# Patient Record
Sex: Female | Born: 1980
Health system: Southern US, Community
[De-identification: ages and names within clinical notes are randomized; demographics above are authoritative.]

## PROBLEM LIST (undated history)

## (undated) DIAGNOSIS — O1490 Unspecified pre-eclampsia, unspecified trimester: Secondary | ICD-10-CM

## (undated) DIAGNOSIS — O139 Gestational [pregnancy-induced] hypertension without significant proteinuria, unspecified trimester: Secondary | ICD-10-CM

## (undated) DIAGNOSIS — D649 Anemia, unspecified: Secondary | ICD-10-CM

## (undated) HISTORY — DX: Unspecified pre-eclampsia, unspecified trimester: O14.90

## (undated) HISTORY — DX: Anemia, unspecified: D64.9

## (undated) HISTORY — DX: Gestational (pregnancy-induced) hypertension without significant proteinuria, unspecified trimester: O13.9

---

## 2006-11-27 DIAGNOSIS — O1490 Unspecified pre-eclampsia, unspecified trimester: Secondary | ICD-10-CM

## 2012-04-07 HISTORY — PX: UMBILICAL HERNIA REPAIR: SHX196

## 2017-04-07 HISTORY — PX: KNEE SURGERY: SHX244

## 2019-12-20 ENCOUNTER — Encounter: Payer: Self-pay | Admitting: *Deleted

## 2019-12-21 ENCOUNTER — Encounter: Payer: Self-pay | Admitting: General Practice

## 2019-12-28 ENCOUNTER — Telehealth (INDEPENDENT_AMBULATORY_CARE_PROVIDER_SITE_OTHER): Payer: Self-pay | Admitting: *Deleted

## 2019-12-28 ENCOUNTER — Other Ambulatory Visit: Payer: Self-pay

## 2019-12-28 DIAGNOSIS — O0990 Supervision of high risk pregnancy, unspecified, unspecified trimester: Secondary | ICD-10-CM | POA: Insufficient documentation

## 2019-12-28 DIAGNOSIS — O99210 Obesity complicating pregnancy, unspecified trimester: Secondary | ICD-10-CM | POA: Insufficient documentation

## 2019-12-28 DIAGNOSIS — O09299 Supervision of pregnancy with other poor reproductive or obstetric history, unspecified trimester: Secondary | ICD-10-CM | POA: Insufficient documentation

## 2019-12-28 DIAGNOSIS — Z87798 Personal history of other (corrected) congenital malformations: Secondary | ICD-10-CM | POA: Insufficient documentation

## 2019-12-28 NOTE — Patient Instructions (Addendum)
If you are in need of transportation to get to and from your appointments in our office.  You can reach Transportation Services by calling [phone] Monday - Friday  7am-6pm.    At [facility], we work as an integrated team, providing care to address both physical and emotional health. Your medical provider may refer you to see our Behavioral Health Clinician [facility]) on the same day you see your medical provider, as availability permits.  Our [facility] is available to all patients, visits generally last between 20-30 minutes, but can be longer or shorter, depending on patient need. The [facility] offers help with stress management, coping with symptoms of depression and anxiety, major life changes , sleep issues, changing risky behavior, grief and loss, life stress, working on personal life goals, and  behavioral health issues, as these all affect your overall health and wellness.  The [facility] is NOT available for the following: FMLA paperwork, court-ordered evaluations, specialty assessments (custody or disability), letters to employers, or obtaining certification for an emotional support animal. The [facility] does not provide long-term therapy. You have the right to refuse integrated behavioral health services, or to reschedule to see the [facility] at a later date.  Exception: If you are having thoughts of suicide, we require that you either see the [facility] for further assessment, or contract for safety with your medical provider. Confidentiality exception: If it is suspected that a child or disabled adult is being abused or neglected, we are required by law to report that to either Child Protective Services or Adult Management consultant.  If you have a diagnosis of Bipolar affective disorder, Schizophrenia, or recurrent Major depressive disorder, we will recommend that you establish care with a psychiatrist, as these are lifelong, chronic conditions, and we want your overall emotional  health and medications to be more closely monitored. If you anticipate needing extended maternity leave due to mental illness, it it recommended you inform your medical provider, so we can put in a referral to a  psychiatrist as soon as possible. The [facility] is unable to recommend an extended maternity leave for mental health issues. Your medical provider or [facility] may refer you to a therapist for ongoing, traditional therapy, or to a psychiatrist, for medication management, if it would benefit your overall health. Depending on your insurance, you may have a copay to see the [facility]. If you are uninsured, it is recommended that you apply for financial assistance. (Forms may be requested at the front desk for in-person visits, via MyChart, or request a form during a virtual visit).  If you see the [facility] more than 6 times, you will have to complete a comprehensive clinical assessment interview with the [facility] to resume integrated services.  For virtual visits with the [facility], you must be physically in the state of [place] at the time of the visit. For example, if you live in [place], you will have to do an in-person visit with the [facility]. If you are going out of the state or country for any reason, the [facility] may see you virtually when you return to [place], but not while you are physically outside of [place].

## 2019-12-28 NOTE — Progress Notes (Addendum)
3:24 Chancey not connected virtually. I called her and explained her visit is virtual. We discussed she had not yet signed up for [facility]  But that I can send a text and we will do visit. She agreed with plan.  New OB Intake  I connected with  Durward Mallard on 12/28/19 at  3:15 PM EDT by MyChart and verified that I am speaking with the correct person using two identifiers. Nurse is located at [facility] and pt is located at home.  I discussed the limitations, risks, security and privacy concerns of performing an evaluation and management service by telephone and the availability of in person appointments. I also discussed with the patient that there may be a patient responsible charge related to this service. The patient expressed understanding and agreed to proceed.  I explained I am completing New OB Intake today. We discussed her EDD of 05/26/20 that is based on LMP of 08/20/19. Pt is G5/P3012. I reviewed her allergies, medications, Medical/Surgical/OB history, and appropriate screenings. I informed her of [facility] services. 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 I informed her we would need to move her new ob to an MD instead of APP as scheduled.  She does have transportation issues. I explained we can send a referral to  [facility] Transportation to use for prenatal appointments. She will sign a release at new ob appointment or sooner. Number to call given to her today. Transporation referral sent. We discussed WIC and she states she already has WIC.   Concerns addressed today  MyChart/Babyscripts Verified patient has received MyChart text and explained she needs to complete registration today after our visit and let us know if she has any issues.   Blood Pressure Cuff Patient does not have a blood pressure cuff . Per chart review does not have insurance. She states she has insurance but not sure of name. I explained when  she is in the office we will follow up and order bp cuff rx if covered by her insurance.  Explained after first prenatal appt we ask all OB patients  To  check  BP weekly and document in Babyscripts.  Anatomy US Explained first scheduled [place] will be around 19 weeks. Anatomy US scheduled for  First available  at 01/11/20. Pt notified to arrive at 0800.   Labs Discussed Avelina Laine genetic screening with patient. Would like both Panorama and Horizon drawn at new OB visit. Routine prenatal labs needed.  First visit review I reviewed new OB appt with pt. I explained she will have a pelvic exam, ob bloodwork with genetic screening, and PAP smear.  Patient states she had pap recently and I asked her to sign a release before new ob appointment if possible- also to sign release for previous prenatal care. Explained pt will be seen by Dr. Debroah Loop instead of APP at first visit; encounter routed to appropriate provider.  Rueben Kassim,RN 12/28/2019  3:29 PM

## 2019-12-29 ENCOUNTER — Inpatient Hospital Stay (HOSPITAL_COMMUNITY)
Admission: AD | Admit: 2019-12-29 | Discharge: 2019-12-30 | Disposition: A | Discharge status: Home / Self Care | Payer: Medicaid Other | Attending: Obstetrics and Gynecology | Admitting: Obstetrics and Gynecology

## 2019-12-29 ENCOUNTER — Other Ambulatory Visit: Payer: Self-pay

## 2019-12-29 ENCOUNTER — Encounter (HOSPITAL_COMMUNITY): Payer: Self-pay | Admitting: Obstetrics and Gynecology

## 2019-12-29 DIAGNOSIS — O26892 Other specified pregnancy related conditions, second trimester: Secondary | ICD-10-CM

## 2019-12-29 DIAGNOSIS — Z20822 Contact with and (suspected) exposure to covid-19: Secondary | ICD-10-CM | POA: Diagnosis not present

## 2019-12-29 DIAGNOSIS — O99891 Other specified diseases and conditions complicating pregnancy: Secondary | ICD-10-CM | POA: Diagnosis not present

## 2019-12-29 DIAGNOSIS — Z3490 Encounter for supervision of normal pregnancy, unspecified, unspecified trimester: Secondary | ICD-10-CM

## 2019-12-29 DIAGNOSIS — R519 Headache, unspecified: Secondary | ICD-10-CM

## 2019-12-29 DIAGNOSIS — O99210 Obesity complicating pregnancy, unspecified trimester: Secondary | ICD-10-CM

## 2019-12-29 DIAGNOSIS — Z8759 Personal history of other complications of pregnancy, childbirth and the puerperium: Secondary | ICD-10-CM | POA: Insufficient documentation

## 2019-12-29 DIAGNOSIS — O09299 Supervision of pregnancy with other poor reproductive or obstetric history, unspecified trimester: Secondary | ICD-10-CM

## 2019-12-29 DIAGNOSIS — Z87798 Personal history of other (corrected) congenital malformations: Secondary | ICD-10-CM

## 2019-12-29 DIAGNOSIS — O0990 Supervision of high risk pregnancy, unspecified, unspecified trimester: Secondary | ICD-10-CM

## 2019-12-29 DIAGNOSIS — Z3A18 18 weeks gestation of pregnancy: Secondary | ICD-10-CM

## 2019-12-29 DIAGNOSIS — O99012 Anemia complicating pregnancy, second trimester: Secondary | ICD-10-CM | POA: Diagnosis present

## 2019-12-29 LAB — URINALYSIS, ROUTINE W REFLEX MICROSCOPIC
Bilirubin Urine: NEGATIVE
Glucose, UA: NEGATIVE mg/dL
Hgb urine dipstick: NEGATIVE
Ketones, ur: NEGATIVE mg/dL
Leukocytes,Ua: NEGATIVE
Nitrite: NEGATIVE
Protein, ur: NEGATIVE mg/dL
Specific Gravity, Urine: 1.02 (ref 1.005–1.030)
pH: 6 (ref 5.0–8.0)

## 2019-12-29 LAB — CBC
HCT: 30 % — ABNORMAL LOW (ref 36.0–46.0)
Hemoglobin: 9.9 g/dL — ABNORMAL LOW (ref 12.0–15.0)
MCH: 27.7 pg (ref 26.0–34.0)
MCHC: 33 g/dL (ref 30.0–36.0)
MCV: 84 fL (ref 80.0–100.0)
Platelets: 224 10*3/uL (ref 150–400)
RBC: 3.57 MIL/uL — ABNORMAL LOW (ref 3.87–5.11)
RDW: 14.7 % (ref 11.5–15.5)
WBC: 7 10*3/uL (ref 4.0–10.5)
nRBC: 0 % (ref 0.0–0.2)

## 2019-12-29 LAB — HEPATIC FUNCTION PANEL
ALT: 10 U/L (ref 0–44)
AST: 11 U/L — ABNORMAL LOW (ref 15–41)
Albumin: 3.2 g/dL — ABNORMAL LOW (ref 3.5–5.0)
Alkaline Phosphatase: 41 U/L (ref 38–126)
Bilirubin, Direct: <0.1 mg/dL (ref 0.0–0.2)
Total Bilirubin: 0.2 mg/dL — ABNORMAL LOW (ref 0.3–1.2)
Total Protein: 6.5 g/dL (ref 6.5–8.1)

## 2019-12-29 LAB — BASIC METABOLIC PANEL
Anion gap: 8 (ref 5–15)
BUN: 8 mg/dL (ref 6–20)
CO2: 23 mmol/L (ref 22–32)
Calcium: 8.4 mg/dL — ABNORMAL LOW (ref 8.9–10.3)
Chloride: 107 mmol/L (ref 98–111)
Creatinine, Ser: 0.58 mg/dL (ref 0.44–1.00)
GFR calc Af Amer: >60 mL/min (ref >60)
GFR calc non Af Amer: >60 mL/min (ref >60)
Glucose, Bld: 73 mg/dL (ref 70–99)
Potassium: 3.5 mmol/L (ref 3.5–5.1)
Sodium: 138 mmol/L (ref 135–145)

## 2019-12-29 LAB — LACTATE DEHYDROGENASE: LDH: 121 U/L (ref 98–192)

## 2019-12-29 LAB — SARS CORONAVIRUS 2 BY RT PCR (HOSPITAL ORDER, PERFORMED IN ~~LOC~~ HOSPITAL LAB): SARS Coronavirus 2: NEGATIVE

## 2019-12-29 LAB — I-STAT BETA HCG BLOOD, ED (MC, WL, AP ONLY): I-stat hCG, quantitative: >2000 m[IU]/mL — ABNORMAL HIGH (ref <5)

## 2019-12-29 MED ORDER — ACETAMINOPHEN 500 MG PO TABS
1000.0000 mg | ORAL_TABLET | Freq: Once | ORAL | Status: AC
  Administered 2019-12-29: 1000 mg via ORAL
  Filled 2019-12-29: qty 2

## 2019-12-29 MED ORDER — ONDANSETRON HCL 4 MG/2ML IJ SOLN
4.0000 mg | Freq: Once | INTRAMUSCULAR | Status: AC
  Administered 2019-12-29: 4 mg via INTRAVENOUS
  Filled 2019-12-29: qty 2

## 2019-12-29 MED ORDER — SODIUM CHLORIDE 0.9 % IV BOLUS
1000.0000 mL | Freq: Once | INTRAVENOUS | Status: AC
  Administered 2019-12-29: 1000 mL via INTRAVENOUS

## 2019-12-29 MED ORDER — BUTALBITAL-APAP-CAFFEINE 50-325-40 MG PO TABS: 1.0000 | ORAL_TABLET | Freq: Once | ORAL | Status: DC

## 2019-12-29 MED ORDER — CYCLOBENZAPRINE HCL 5 MG PO TABS
10.0000 mg | ORAL_TABLET | Freq: Once | ORAL | Status: AC
  Administered 2019-12-29: 10 mg via ORAL
  Filled 2019-12-29: qty 2

## 2019-12-29 MED ORDER — FERROUS SULFATE 325 (65 FE) MG PO TABS: 325.0000 mg | ORAL_TABLET | ORAL | 3 refills | Status: DC

## 2019-12-29 NOTE — ED Provider Notes (Signed)
[facility]-EMERGENCY DEPT Provider Note   CSN: [id number] Arrival date & time: 12/29/19  1807     History Chief Complaint  Patient presents with  . Headache    Sophia Meza is a 39 y.o. female. G5P4 19 weeks by LMP presents to ER with concern for headache.  She reports that she thinks her last menstrual period is 19 weeks ago but is unsure of dating.  Has not had confirmatory ultrasound.  She reports over the last 3 days she has been having a dull achy headache.  Relatively constant, not sudden onset, not worse headache of her life.  Currently 8 out of 10, frontal.  States that no alleviating or aggravating factors.  Has tried Tylenol with minimal relief.  Reports headache similar to when she had a headache when she was diagnosed with preeclampsia with her first pregnancy.  No fevers, numbness, weakness, vision changes or speech changes.  No known Covid exposures.  Had mild nonproductive cough today.  No neck pain or neck stiffness.  HPI     Past Medical History:  Diagnosis Date  . Preeclampsia     Patient Active Problem List   Diagnosis Date Noted  . Supervision of high risk pregnancy, antepartum 12/28/2019  . Obesity in pregnancy, antepartum 12/28/2019  . History of pre-eclampsia in prior pregnancy, currently pregnant 12/28/2019  . History of birth defect 12/28/2019    Past Surgical History:  Procedure Laterality Date  . KNEE SURGERY Right 2019  . UMBILICAL HERNIA REPAIR  2014     OB History    Gravida  5   Para  3   Term  3   Preterm  0   AB  1   Living  2     SAB  0   TAB  0   Ectopic  0   Multiple  0   Live Births  3           Family History  Problem Relation Age of Onset  . Diabetes Mother   . Diabetes Father     Social History   Tobacco Use  . Smoking status: Never Smoker  . Smokeless tobacco: Never Used  Vaping Use  . Vaping Use: Never used  Substance Use Topics  . Alcohol use: Not Currently    Comment:  occasionally-social  . Drug use: Never    Home Medications Prior to Admission medications   Medication Sig Start Date End Date Taking? Authorizing Provider  Prenatal Vit-Fe Fumarate-FA (PRENATAL VITAMINS PO) Take 1 tablet by mouth daily.    [provider]    Allergies    Patient has no known allergies.  Review of Systems   Review of Systems  Constitutional: Negative for chills, fatigue and fever.  HENT: Negative for ear pain and sore throat.   Eyes: Negative for pain and visual disturbance.  Respiratory: Positive for cough. Negative for shortness of breath.   Cardiovascular: Negative for chest pain and palpitations.  Gastrointestinal: Negative for abdominal pain and vomiting.  Genitourinary: Negative for dysuria and hematuria.  Musculoskeletal: Negative for arthralgias and back pain.  Skin: Negative for color change and rash.  Neurological: Positive for headaches. Negative for seizures and syncope.  All other systems reviewed and are negative.   Physical Exam Updated Vital Signs BP 132/79   Pulse 63   Temp 99.4 F (37.4 C) (Oral)   Resp 16   Ht 5' 6.5" (1.689 m)   Wt 121.1 kg  LMP 08/20/2019   SpO2 99%   BMI 42.45 kg/m   Physical Exam Vitals and nursing note reviewed.  Constitutional:      General: She is not in acute distress.    Appearance: She is well-developed.  HENT:     Head: Normocephalic and atraumatic.  Eyes:     Conjunctiva/sclera: Conjunctivae normal.  Cardiovascular:     Rate and Rhythm: Normal rate and regular rhythm.     Heart sounds: No murmur heard.   Pulmonary:     Effort: Pulmonary effort is normal. No respiratory distress.     Breath sounds: Normal breath sounds.  Abdominal:     Palpations: Abdomen is soft.     Tenderness: There is no abdominal tenderness.  Musculoskeletal:     Cervical back: Neck supple.  Skin:    General: Skin is warm and dry.  Neurological:     Comments: AAOx3 CN 2-12 intact, speech clear visual  fields intact 5/5 strength in b/l UE and LE Sensation to light touch intact in b/l UE and LE Normal FNF Normal gait  Psychiatric:        Mood and Affect: Mood normal.        Behavior: Behavior normal.     ED Results / Procedures / Treatments   Labs (all labs ordered are listed, but only abnormal results are displayed) Labs Reviewed  BASIC METABOLIC PANEL - Abnormal; Notable for the following components:      Result Value   Calcium 8.4 (*)    All other components within normal limits  CBC - Abnormal; Notable for the following components:   RBC 3.57 (*)    Hemoglobin 9.9 (*)    HCT 30.0 (*)    All other components within normal limits  HEPATIC FUNCTION PANEL - Abnormal; Notable for the following components:   Albumin 3.2 (*)    AST 11 (*)    Total Bilirubin 0.2 (*)    All other components within normal limits  I-STAT BETA HCG BLOOD, ED (MC, WL, AP ONLY) - Abnormal; Notable for the following components:   I-stat hCG, quantitative >2,000.0 (*)    All other components within normal limits  RESPIRATORY PANEL BY RT PCR (FLU A&B, COVID)  URINALYSIS, ROUTINE W REFLEX MICROSCOPIC  LACTATE DEHYDROGENASE  PROTIME-INR    EKG None  Radiology No results found.  Procedures Procedures (including critical care time)  Medications Ordered in ED Medications  sodium chloride 0.9 % bolus 1,000 mL (1,000 mLs Intravenous New Bag/Given 12/29/19 2046)  ondansetron (ZOFRAN) injection 4 mg (4 mg Intravenous Given 12/29/19 2045)  acetaminophen (TYLENOL) tablet 1,000 mg (1,000 mg Oral Given 12/29/19 2040)    ED Course  I have reviewed the triage vital signs and the nursing notes.  Pertinent labs & imaging results that were available during my care of the patient were reviewed by me and considered in my medical decision making (see chart for details).    MDM Rules/Calculators/A&P                          39 year old lady presented to ER with headache.  She is [redacted] weeks pregnant by LMP but  has not had ultrasound to confirm dating.  Has history of preeclampsia, reports similar headache when she was previously diagnosed with preeclampsia.  On exam, patient is noted to be well-appearing, normal neuro exam, normal neck exam.  Basic labs were grossly normal.  Her repeat blood pressure was 132/79.  Discussed the case with Dr. Alysia Penna at [facility].  He recommends transferring to MAU for further evaluation work-up, rule out preeclampsia.   Patient agreeable. Will go POV. Will send covid swab.  Final Clinical Impression(s) / ED Diagnoses Final diagnoses:  Nonintractable headache, unspecified chronicity pattern, unspecified headache type  Pregnancy, unspecified gestational age    Rx / DC Orders ED Discharge Orders    None       Milagros Loll, MD 12/29/19 2200

## 2019-12-29 NOTE — Discharge Instructions (Signed)
Second Trimester of Pregnancy The second trimester is from week 14 through week 27 (months 4 through 6). The second trimester is often a time when you feel your best. Your body has adjusted to being pregnant, and you begin to feel better physically. Usually, morning sickness has lessened or quit completely, you may have more energy, and you may have an increase in appetite. The second trimester is also a time when the fetus is growing rapidly. At the end of the sixth month, the fetus is about 9 inches long and weighs about 1 pounds. You will likely begin to feel the baby move (quickening) between 16 and 20 weeks of pregnancy. Body changes during your second trimester Your body continues to go through many changes during your second trimester. The changes vary from woman to woman.  Your weight will continue to increase. You will notice your lower abdomen bulging out.  You may begin to get stretch marks on your hips, abdomen, and breasts.  You may develop headaches that can be relieved by medicines. The medicines should be approved by your health care provider.  You may urinate more often because the fetus is pressing on your bladder.  You may develop or continue to have heartburn as a result of your pregnancy.  You may develop constipation because certain hormones are causing the muscles that push waste through your intestines to slow down.  You may develop hemorrhoids or swollen, bulging veins (varicose veins).  You may have back pain. This is caused by: ? Weight gain. ? Pregnancy hormones that are relaxing the joints in your pelvis. ? A shift in weight and the muscles that support your balance.  Your breasts will continue to grow and they will continue to become tender.  Your gums may bleed and may be sensitive to brushing and flossing.  Dark spots or blotches (chloasma, mask of pregnancy) may develop on your face. This will likely fade after the baby is born.  A dark line from your  belly button to the pubic area (linea nigra) may appear. This will likely fade after the baby is born.  You may have changes in your hair. These can include thickening of your hair, rapid growth, and changes in texture. Some women also have hair loss during or after pregnancy, or hair that feels dry or thin. Your hair will most likely return to normal after your baby is born. What to expect at prenatal visits During a routine prenatal visit:  You will be weighed to make sure you and the fetus are growing normally.  Your blood pressure will be taken.  Your abdomen will be measured to track your baby's growth.  The fetal heartbeat will be listened to.  Any test results from the previous visit will be discussed. Your health care provider may ask you:  How you are feeling.  If you are feeling the baby move.  If you have had any abnormal symptoms, such as leaking fluid, bleeding, severe headaches, or abdominal cramping.  If you are using any tobacco products, including cigarettes, chewing tobacco, and electronic cigarettes.  If you have any questions. Other tests that may be performed during your second trimester include:  Blood tests that check for: ? Low iron levels (anemia). ? High blood sugar that affects pregnant women (gestational diabetes) between 24 and 28 weeks. ? Rh antibodies. This is to check for a protein on red blood cells (Rh factor).  Urine tests to check for infections, diabetes, or protein in the   urine.  An ultrasound to confirm the proper growth and development of the baby.  An amniocentesis to check for possible genetic problems.  Fetal screens for spina bifida and Down syndrome.  HIV (human immunodeficiency virus) testing. Routine prenatal testing includes screening for HIV, unless you choose not to have this test. Follow these instructions at home: Medicines  Follow your health care provider's instructions regarding medicine use. Specific medicines may be  either safe or unsafe to take during pregnancy.  Take a prenatal vitamin that contains at least 600 micrograms (mcg) of folic acid.  If you develop constipation, try taking a stool softener if your health care provider approves. Eating and drinking   Eat a balanced diet that includes fresh fruits and vegetables, whole grains, good sources of protein such as meat, eggs, or tofu, and low-fat dairy. Your health care provider will help you determine the amount of weight gain that is right for you.  Avoid raw meat and uncooked cheese. These carry germs that can cause birth defects in the baby.  If you have low calcium intake from food, talk to your health care provider about whether you should take a daily calcium supplement.  Limit foods that are high in fat and processed sugars, such as fried and sweet foods.  To prevent constipation: ? Drink enough fluid to keep your urine clear or pale yellow. ? Eat foods that are high in fiber, such as fresh fruits and vegetables, whole grains, and beans. Activity  Exercise only as directed by your health care provider. Most women can continue their usual exercise routine during pregnancy. Try to exercise for 30 minutes at least 5 days a week. Stop exercising if you experience uterine contractions.  Avoid heavy lifting, wear low heel shoes, and practice good posture.  A sexual relationship may be continued unless your health care provider directs you otherwise. Relieving pain and discomfort  Wear a good support bra to prevent discomfort from breast tenderness.  Take warm sitz baths to soothe any pain or discomfort caused by hemorrhoids. Use hemorrhoid cream if your health care provider approves.  Rest with your legs elevated if you have leg cramps or low back pain.  If you develop varicose veins, wear support hose. Elevate your feet for 15 minutes, 3-4 times a day. Limit salt in your diet. Prenatal Care  Write down your questions. Take them to  your prenatal visits.  Keep all your prenatal visits as told by your health care provider. This is important. Safety  Wear your seat belt at all times when driving.  Make a list of emergency phone numbers, including numbers for family, friends, the hospital, and police and fire departments. General instructions  Ask your health care provider for a referral to a local prenatal education class. Begin classes no later than the beginning of month 6 of your pregnancy.  Ask for help if you have counseling or nutritional needs during pregnancy. Your health care provider can offer advice or refer you to specialists for help with various needs.  Do not use hot tubs, steam rooms, or saunas.  Do not douche or use tampons or scented sanitary pads.  Do not cross your legs for long periods of time.  Avoid cat litter boxes and soil used by cats. These carry germs that can cause birth defects in the baby and possibly loss of the fetus by miscarriage or stillbirth.  Avoid all smoking, herbs, alcohol, and unprescribed drugs. Chemicals in these products can affect the formation   and growth of the baby.  Do not use any products that contain nicotine or tobacco, such as cigarettes and e-cigarettes. If you need help quitting, ask your health care provider.  Visit your dentist if you have not gone yet during your pregnancy. Use a soft toothbrush to brush your teeth and be gentle when you floss. Contact a health care provider if:  You have dizziness.  You have mild pelvic cramps, pelvic pressure, or nagging pain in the abdominal area.  You have persistent nausea, vomiting, or diarrhea.  You have a bad smelling vaginal discharge.  You have pain when you urinate. Get help right away if:  You have a fever.  You are leaking fluid from your vagina.  You have spotting or bleeding from your vagina.  You have severe abdominal cramping or pain.  You have rapid weight gain or weight loss.  You have  shortness of breath with chest pain.  You notice sudden or extreme swelling of your face, hands, ankles, feet, or legs.  You have not felt your baby move in over an hour.  You have severe headaches that do not go away when you take medicine.  You have vision changes. Summary  The second trimester is from week 14 through week 27 (months 4 through 6). It is also a time when the fetus is growing rapidly.  Your body goes through many changes during pregnancy. The changes vary from woman to woman.  Avoid all smoking, herbs, alcohol, and unprescribed drugs. These chemicals affect the formation and growth your baby.  Do not use any tobacco products, such as cigarettes, chewing tobacco, and e-cigarettes. If you need help quitting, ask your health care provider.  Contact your health care provider if you have any questions. Keep all prenatal visits as told by your health care provider. This is important. This information is not intended to replace advice given to you by your health care provider. Make sure you discuss any questions you have with your health care provider. Document Revised: 07/16/2018 Document Reviewed: 04/29/2016 Elsevier Patient Education  2020 [employer]        Round Ligament Pain  The round ligament is a cord of muscle and tissue that helps support the uterus. It can become a source of pain during pregnancy if it becomes stretched or twisted as the baby grows. The pain usually begins in the second trimester (13-28 weeks) of pregnancy, and it can come and go until the baby is delivered. It is not a serious problem, and it does not cause harm to the baby. Round ligament pain is usually a short, sharp, and pinching pain, but it can also be a dull, lingering, and aching pain. The pain is felt in the lower side of the abdomen or in the groin. It usually starts deep in the groin and moves up to the outside of the hip area. The pain may occur when you:  Suddenly change  position, such as quickly going from a sitting to standing position.  Roll over in bed.  Cough or sneeze.  Do physical activity. Follow these instructions at home:   Watch your condition for any changes.  When the pain starts, relax. Then try any of these methods to help with the pain: ? Sitting down. ? Flexing your knees up to your abdomen. ? Lying on your side with one pillow under your abdomen and another pillow between your legs. ? Sitting in a warm bath for 15-20 minutes or until the pain  goes away.  Take over-the-counter and prescription medicines only as told by your health care provider.  Move slowly when you sit down or stand up.  Avoid long walks if they cause pain.  Stop or reduce your physical activities if they cause pain.  Keep all follow-up visits as told by your health care provider. This is important. Contact a health care provider if:  Your pain does not go away with treatment.  You feel pain in your back that you did not have before.  Your medicine is not helping. Get help right away if:  You have a fever or chills.  You develop uterine contractions.  You have vaginal bleeding.  You have nausea or vomiting.  You have diarrhea.  You have pain when you urinate. Summary  Round ligament pain is felt in the lower abdomen or groin. It is usually a short, sharp, and pinching pain. It can also be a dull, lingering, and aching pain.  This pain usually begins in the second trimester (13-28 weeks). It occurs because the uterus is stretching with the growing baby, and it is not harmful to the baby.  You may notice the pain when you suddenly change position, when you cough or sneeze, or during physical activity.  Relaxing, flexing your knees to your abdomen, lying on one side, or taking a warm bath may help to get rid of the pain.  Get help from your health care provider if the pain does not go away or if you have vaginal bleeding, nausea, vomiting,  diarrhea, or painful urination. This information is not intended to replace advice given to you by your health care provider. Make sure you discuss any questions you have with your health care provider. Document Revised: 09/09/2017 Document Reviewed: 09/09/2017 Elsevier Patient Education  2020 [employer].        Preterm Labor and Birth Information  The normal length of a pregnancy is 39-41 weeks. Preterm labor is when labor starts before 37 completed weeks of pregnancy. What are the risk factors for preterm labor? Preterm labor is more likely to occur in women who:  Have certain infections during pregnancy such as a bladder infection, sexually transmitted infection, or infection inside the uterus (chorioamnionitis).  Have a shorter-than-normal cervix.  Have gone into preterm labor before.  Have had surgery on their cervix.  Are younger than age 8 or older than age 68.  Are African American.  Are pregnant with twins or multiple babies (multiple gestation).  Take street drugs or smoke while pregnant.  Do not gain enough weight while pregnant.  Became pregnant shortly after having been pregnant. What are the symptoms of preterm labor? Symptoms of preterm labor include:  Cramps similar to those that can happen during a menstrual period. The cramps may happen with diarrhea.  Pain in the abdomen or lower back.  Regular uterine contractions that may feel like tightening of the abdomen.  A feeling of increased pressure in the pelvis.  Increased watery or bloody mucus discharge from the vagina.  Water breaking (ruptured amniotic sac). Why is it important to recognize signs of preterm labor? It is important to recognize signs of preterm labor because babies who are born prematurely may not be fully developed. This can put them at an increased risk for:  Long-term (chronic) heart and lung problems.  Difficulty immediately after birth with regulating body systems,  including blood sugar, body temperature, heart rate, and breathing rate.  Bleeding in the brain.  Cerebral palsy.  Learning difficulties.  Death. These risks are highest for babies who are born before 34 weeks of pregnancy. How is preterm labor treated? Treatment depends on the length of your pregnancy, your condition, and the health of your baby. It may involve:  Having a stitch (suture) placed in your cervix to prevent your cervix from opening too early (cerclage).  Taking or being given medicines, such as: ? Hormone medicines. These may be given early in pregnancy to help support the pregnancy. ? Medicine to stop contractions. ? Medicines to help mature the baby's lungs. These may be prescribed if the risk of delivery is high. ? Medicines to prevent your baby from developing cerebral palsy. If the labor happens before 34 weeks of pregnancy, you may need to stay in the hospital. What should I do if I think I am in preterm labor? If you think that you are going into preterm labor, call your health care provider right away. How can I prevent preterm labor in future pregnancies? To increase your chance of having a full-term pregnancy:  Do not use any tobacco products, such as cigarettes, chewing tobacco, and e-cigarettes. If you need help quitting, ask your health care provider.  Do not use street drugs or medicines that have not been prescribed to you during your pregnancy.  Talk with your health care provider before taking any herbal supplements, even if you have been taking them regularly.  Make sure you gain a healthy amount of weight during your pregnancy.  Watch for infection. If you think that you might have an infection, get it checked right away.  Make sure to tell your health care provider if you have gone into preterm labor before. This information is not intended to replace advice given to you by your health care provider. Make sure you discuss any questions you have  with your health care provider. Document Revised: 07/16/2018 Document Reviewed: 08/15/2015 Elsevier Patient Education  2020 [employer] Inc.        Pregnancy and Anemia  Anemia is a condition in which the concentration of red blood cells, or hemoglobin, in the blood is below normal. Hemoglobin is a substance in red blood cells that carries oxygen to the tissues of the body. Anemia results when enough oxygen does not reach these tissues. Anemia is common during pregnancy because the woman's body needs more blood volume and blood cells to provide nutrition to the fetus. The fetus needs iron and folic acid as it is developing. Your body may not produce enough red blood cells because of this. Also, during pregnancy, the liquid part of the blood (plasma) increases by about 30-50%, and the red blood cells increase by only 20%. This lowers the concentration of the red blood cells and creates a natural anemia-like situation. What are the causes? The most common cause of anemia during pregnancy is not having enough iron in the body to make red blood cells (iron deficiency anemia). Other causes may include:  Folic acid deficiency.  Vitamin B12 deficiency.  Certain prescription or over-the-counter medicines.  Certain medical conditions or infections that destroy red blood cells.  A low platelet count and bleeding caused by antibodies that go through the placenta to the fetus from the mother's blood. What are the signs or symptoms? Mild anemia may not be noticeable. If it becomes severe, symptoms may include:  Feeling tired (fatigue).  Shortness of breath, especially during activity.  Weakness.  Fainting.  Pale looking skin.  Headaches.  A fast or irregular heartbeat (  palpitations).  Dizziness. How is this diagnosed? This condition may be diagnosed based on:  Your medical history and a physical exam.  Blood tests. How is this treated? Treatment for anemia during pregnancy depends  on the cause of the anemia. Treatment can include:  Dietary changes.  Supplements of iron, vitamin B12, or folic acid.  A blood transfusion. This may be needed if anemia is severe.  Hospitalization. This may be needed if there is a lot of blood loss or severe anemia. Follow these instructions at home:  Follow recommendations from your dietitian or health care provider about changing your diet.  Increase your vitamin C intake. This will help the stomach absorb more iron. Some foods that are high in vitamin C include: ? Oranges. ? Peppers. ? Tomatoes. ? Mangoes.  Eat a diet rich in iron. This would include foods such as: ? Liver. ? Beef. ? Eggs. ? Whole grains. ? Spinach. ? Dried fruit.  Take iron and vitamins as told by your health care provider.  Eat green leafy vegetables. These are a good source of folic acid.  Keep all follow-up visits as told by your health care provider. This is important. Contact a health care provider if:  You have frequent or lasting headaches.  You look pale.  You bruise easily. Get help right away if:  You have extreme weakness, shortness of breath, or chest pain.  You become dizzy or have trouble concentrating.  You have heavy vaginal bleeding.  You develop a rash.  You have bloody or black, tarry stools.  You faint.  You vomit up blood.  You vomit repeatedly.  You have abdominal pain.  You have a fever.  You are dehydrated. Summary  Anemia is a condition in which the concentration of red blood cells or hemoglobin in the blood is below normal.  Anemia is common during pregnancy because the woman's body needs more blood volume and blood cells to provide nutrition to the fetus.  The most common cause of anemia during pregnancy is not having enough iron in the body to make red blood cells (iron deficiency anemia).  Mild anemia may not be noticeable. If it becomes severe, symptoms may include feeling tired and weak. This  information is not intended to replace advice given to you by your health care provider. Make sure you discuss any questions you have with your health care provider. Document Revised: 09/07/2018 Document Reviewed: 04/29/2016 Elsevier Patient Education  2020 [employer].

## 2019-12-29 NOTE — ED Triage Notes (Signed)
Pt arrived via walk in, c/o headache x2 days, no relief from tylenol. Approx [redacted]wks pregnant. Hx of preeclampsia.

## 2019-12-29 NOTE — MAU Provider Note (Addendum)
History     CSN: [id number]  Arrival date and time: 12/29/19 1807   First Provider Initiated Contact with Patient 12/29/19 2248      Chief Complaint  Patient presents with  . Headache   Ms. Sophia Meza is a 39 y.o. [id number] at [redacted]w[redacted]d who presents to MAU after she was transferred from [facility] for a headache. Patient reports she does not know why she was transferred here. Patient reports she was given Tylenol for her HA which helped, but continues to rate her HA as 6/10 at this time. Patient reports this headache started several days ago and when she takes Tylenol and eats something at home the HA will resolve, but then it will return. Patient states she is unsure of how far along she is, but believes she is 19 weeks by her period. Patient denies hx of migraines or other HA diagnosis. Patient was given Tylenol, Zofran and a bolus of fluid in [facility]. Patient reports she does not know why she was given Zofran as she told providers she was not nauseous.  Pt denies VB, LOF, ctx, vaginal discharge/odor/itching. Pt denies N/V, abdominal pain, constipation, diarrhea, or urinary problems. Pt denies fever, chills, fatigue, sweating or changes in appetite. Pt denies SOB or chest pain. Pt denies dizziness, light-headedness, weakness.  Problems this pregnancy include: pt has not yet been seen. Allergies? NKDA Current medications/supplements? PNVs Prenatal care provider? [facility], 01/11/2020   OB History    Gravida  5   Para  3   Term  3   Preterm  0   AB  1   Living  2     SAB  0   TAB  0   Ectopic  0   Multiple  0   Live Births  3           Past Medical History:  Diagnosis Date  . Preeclampsia     Past Surgical History:  Procedure Laterality Date  . KNEE SURGERY Right 2019  . UMBILICAL HERNIA REPAIR  2014    Family History  Problem Relation Age of Onset  . Diabetes Mother   . Diabetes Father     Social History   Tobacco Use  . Smoking status: Never  Smoker  . Smokeless tobacco: Never Used  Vaping Use  . Vaping Use: Never used  Substance Use Topics  . Alcohol use: Not Currently    Comment: occasionally-social  . Drug use: Never    Allergies: No Known Allergies  Medications Prior to Admission  Medication Sig Dispense Refill Last Dose  . Prenatal Vit-Fe Fumarate-FA (PRENATAL VITAMINS PO) Take 1 tablet by mouth daily.   12/29/2019 at Unknown time    Review of Systems  Constitutional: Negative for chills, diaphoresis, fatigue and fever.  Eyes: Negative for visual disturbance.  Respiratory: Negative for shortness of breath.   Cardiovascular: Negative for chest pain.  Gastrointestinal: Negative for abdominal pain, constipation, diarrhea, nausea and vomiting.  Genitourinary: Negative for dysuria, flank pain, frequency, pelvic pain, urgency, vaginal bleeding and vaginal discharge.  Neurological: Positive for headaches. Negative for dizziness, weakness and light-headedness.   Physical Exam   Blood pressure 123/72, pulse (!) 58, temperature 98.8 F (37.1 C), temperature source Oral, resp. rate 17, height 5' 6.5" (1.689 m), weight 121.1 kg, last menstrual period 08/20/2019, SpO2 99 %.  Patient Vitals for the past 24 hrs:  BP Temp Temp src Pulse Resp SpO2 Height Weight  12/29/19 2228 123/72 98.8 F (37.1 C) Oral Marland Kitchen)  58 17 -- -- --  12/29/19 2047 132/79 -- -- 63 16 99 % -- --  12/29/19 1816 -- -- -- -- -- -- 5' 6.5" (1.689 m) 121.1 kg  12/29/19 1814 127/85 99.4 F (37.4 C) Oral 79 20 99 % -- --   Physical Exam Vitals and nursing note reviewed.  Constitutional:      General: She is not in acute distress.    Appearance: Normal appearance. She is not ill-appearing, toxic-appearing or diaphoretic.  HENT:     Head: Normocephalic and atraumatic.  Pulmonary:     Effort: Pulmonary effort is normal.  Neurological:     Mental Status: She is alert and oriented to person, place, and time.  Psychiatric:        Mood and Affect: Mood  normal.        Behavior: Behavior normal.        Thought Content: Thought content normal.        Judgment: Judgment normal.    Results for orders placed or performed during the hospital encounter of 12/29/19 (from the past 24 hour(s))  Basic metabolic panel     Status: Abnormal   Collection Time: 12/29/19  7:03 PM  Result Value Ref Range   Sodium 138 135 - 145 mmol/L   Potassium 3.5 3.5 - 5.1 mmol/L   Chloride 107 98 - 111 mmol/L   CO2 23 22 - 32 mmol/L   Glucose, Bld 73 70 - 99 mg/dL   BUN 8 6 - 20 mg/dL   Creatinine, Ser 1.610.58 0.44 - 1.00 mg/dL   Calcium 8.4 (L) 8.9 - 10.3 mg/dL   GFR calc non Af Amer >60 >60 mL/min   GFR calc Af Amer >60 >60 mL/min   Anion gap 8 5 - 15  CBC     Status: Abnormal   Collection Time: 12/29/19  7:03 PM  Result Value Ref Range   WBC 7.0 4.0 - 10.5 K/uL   RBC 3.57 (L) 3.87 - 5.11 MIL/uL   Hemoglobin 9.9 (L) 12.0 - 15.0 g/dL   HCT 09.630.0 (L) 36 - 46 %   MCV 84.0 80.0 - 100.0 fL   MCH 27.7 26.0 - 34.0 pg   MCHC 33.0 30.0 - 36.0 g/dL   RDW 04.514.7 40.911.5 - 81.115.5 %   Platelets 224 150 - 400 K/uL   nRBC 0.0 0.0 - 0.2 %  Lactate dehydrogenase     Status: None   Collection Time: 12/29/19  7:03 PM  Result Value Ref Range   LDH 121 98 - 192 U/L  Hepatic function panel     Status: Abnormal   Collection Time: 12/29/19  7:03 PM  Result Value Ref Range   Total Protein 6.5 6.5 - 8.1 g/dL   Albumin 3.2 (L) 3.5 - 5.0 g/dL   AST 11 (L) 15 - 41 U/L   ALT 10 0 - 44 U/L   Alkaline Phosphatase 41 38 - 126 U/L   Total Bilirubin 0.2 (L) 0.3 - 1.2 mg/dL   Bilirubin, Direct <9.1<0.1 0.0 - 0.2 mg/dL   Indirect Bilirubin NOT CALCULATED 0.3 - 0.9 mg/dL  I-Stat beta hCG blood, ED     Status: Abnormal   Collection Time: 12/29/19  7:09 PM  Result Value Ref Range   I-stat hCG, quantitative >2,000.0 (H) <5 mIU/mL   Comment 3          Urinalysis, Routine w reflex microscopic     Status: None   Collection Time: 12/29/19  8:14 PM  Result Value Ref Range   Color, Urine YELLOW  YELLOW   APPearance CLEAR CLEAR   Specific Gravity, Urine 1.020 1.005 - 1.030   pH 6.0 5.0 - 8.0   Glucose, UA NEGATIVE NEGATIVE mg/dL   Hgb urine dipstick NEGATIVE NEGATIVE   Bilirubin Urine NEGATIVE NEGATIVE   Ketones, ur NEGATIVE NEGATIVE mg/dL   Protein, ur NEGATIVE NEGATIVE mg/dL   Nitrite NEGATIVE NEGATIVE   Leukocytes,Ua NEGATIVE NEGATIVE  SARS Coronavirus 2 by RT PCR (hospital order, performed in [facility] hospital lab)     Status: None   Collection Time: 12/29/19  8:47 PM  Result Value Ref Range   SARS Coronavirus 2 NEGATIVE NEGATIVE   No results found.  MAU Course  Procedures  MDM -transfer from [facility] for preeclampsia evaluation and uncertain dates -no elevated blood pressures at [facility] or in MAU -CBC: H/H 9.9/30.0, platelets 224 -CMP: serum creatinine 0.58, AST/ALT 11/10 -PCr ordered to complete baseline labs -Tylenol, Zofran and 1L bolus of normal saline given in [facility] -Flexeril 10mg  given in MAU - pending at time of care transfer -[facility] @1145PM  Nugent, [facility], NP  11:45 PM 12/29/2019   Orders Placed This Encounter  Procedures  . SARS Coronavirus 2 by RT PCR (hospital order, performed in [facility] hospital lab)    Standing Status:   Standing    Number of Occurrences:   1  . 12/31/2019 MFM OB LIMITED    Standing Status:   Standing    Number of Occurrences:   1    Order Specific Question:   Symptom/Reason for Exam    Answer:   Uncertain dates, antepartum [facility]  . Basic metabolic panel    Standing Status:   Standing    Number of Occurrences:   1  . CBC    Standing Status:   Standing    Number of Occurrences:   1  . Urinalysis, Routine w reflex microscopic    Standing Status:   Standing    Number of Occurrences:   1  . Lactate dehydrogenase    Standing Status:   Standing    Number of Occurrences:   1  . Hepatic function panel    Standing Status:   Standing    Number of Occurrences:   1  . Protime-INR    Standing Status:    Standing    Number of Occurrences:   1  . Protein / creatinine ratio, urine    Standing Status:   Standing    Number of Occurrences:   1  . Consult to obstetrics / gynecology  ALL PATIENTS BEING ADMITTED/HAVING PROCEDURES NEED COVID-19 SCREENING    ALL PATIENTS BEING ADMITTED/HAVING PROCEDURES NEED COVID-19 SCREENING    Standing Status:   Standing    Number of Occurrences:   1    Order Specific Question:   Place call to:    Answer:   oncall OB/Gyn    Order Specific Question:   Reason for Consult    Answer:   Admit  . I-Stat beta hCG blood, ED    Standing Status:   Standing    Number of Occurrences:   1  . Saline lock IV    Standing Status:   Standing    Number of Occurrences:   1   Meds ordered this encounter  Medications  . sodium chloride 0.9 % bolus 1,000 mL  . ondansetron (ZOFRAN) injection 4 mg  . acetaminophen (TYLENOL) tablet 1,000  mg  . DISCONTD: butalbital-acetaminophen-caffeine (FIORICET) 50-325-40 MG per tablet 1 tablet  . cyclobenzaprine (FLEXERIL) tablet 10 mg    Assessment and Plan   1. Pregnancy headache in second trimester   2. Nonintractable headache, unspecified chronicity pattern, unspecified headache type   3. Pregnancy, unspecified gestational age   16. Supervision of high risk pregnancy, antepartum   5. Obesity in pregnancy, antepartum   6. History of pre-eclampsia in prior pregnancy, currently pregnant   7. History of birth defect   8. Uncertain dates, antepartum   18. [redacted] weeks gestation of pregnancy     Allergies as of 12/29/2019   No Known Allergies     Medication List    TAKE these medications   PRENATAL VITAMINS PO Take 1 tablet by mouth daily.       -keep scheduled appt for OB care -return MAU precautions given -pt discharged to home in stable condition  Vitals:   12/29/19 2047 12/29/19 2228 12/30/19 0015 12/30/19 0017  BP: 132/79 123/72  131/63  Pulse: 63 (!) 58  65  Resp: 16 17  17   Temp:  98.8 F (37.1 C)    TempSrc:  Oral     SpO2: 99%  100% 100%  Weight:      Height:       Patient left without being done, but has appt at [facility] on 10/6 for 12/6 and then New OB visit the next week   Pr/Cr ratio not resulted yet, but none of her BPs were elevated and she  Is less than 24 weeks, so would not have represented preeclampsia  [place], CNM

## 2019-12-29 NOTE — MAU Note (Signed)
Pt sent over from [facility] ED . Pt presented due to headache x 2-3 days. Took tylenol at home without relief. States headache is better now but still there

## 2019-12-30 LAB — PROTEIN / CREATININE RATIO, URINE
Creatinine, Urine: 146.98 mg/dL
Protein Creatinine Ratio: 0.06 mg/mg{Cre} (ref 0.00–0.15)
Total Protein, Urine: 9 mg/dL

## 2020-01-03 ENCOUNTER — Encounter: Payer: Self-pay | Admitting: Certified Nurse Midwife

## 2020-01-11 ENCOUNTER — Ambulatory Visit (HOSPITAL_BASED_OUTPATIENT_CLINIC_OR_DEPARTMENT_OTHER): Payer: Self-pay

## 2020-01-11 ENCOUNTER — Ambulatory Visit: Payer: Self-pay

## 2020-01-11 ENCOUNTER — Ambulatory Visit: Payer: Self-pay | Attending: Obstetrics & Gynecology | Admitting: *Deleted

## 2020-01-11 ENCOUNTER — Other Ambulatory Visit: Payer: Self-pay | Admitting: *Deleted

## 2020-01-11 ENCOUNTER — Other Ambulatory Visit: Payer: Self-pay

## 2020-01-11 DIAGNOSIS — O99210 Obesity complicating pregnancy, unspecified trimester: Secondary | ICD-10-CM

## 2020-01-11 DIAGNOSIS — O09522 Supervision of elderly multigravida, second trimester: Secondary | ICD-10-CM | POA: Insufficient documentation

## 2020-01-11 DIAGNOSIS — O99212 Obesity complicating pregnancy, second trimester: Secondary | ICD-10-CM | POA: Insufficient documentation

## 2020-01-11 DIAGNOSIS — Z87798 Personal history of other (corrected) congenital malformations: Secondary | ICD-10-CM

## 2020-01-11 DIAGNOSIS — Z3A24 24 weeks gestation of pregnancy: Secondary | ICD-10-CM | POA: Insufficient documentation

## 2020-01-11 DIAGNOSIS — O09299 Supervision of pregnancy with other poor reproductive or obstetric history, unspecified trimester: Secondary | ICD-10-CM

## 2020-01-11 DIAGNOSIS — O09529 Supervision of elderly multigravida, unspecified trimester: Secondary | ICD-10-CM

## 2020-01-11 DIAGNOSIS — O0990 Supervision of high risk pregnancy, unspecified, unspecified trimester: Secondary | ICD-10-CM

## 2020-01-11 DIAGNOSIS — O1492 Unspecified pre-eclampsia, second trimester: Secondary | ICD-10-CM | POA: Insufficient documentation

## 2020-01-12 ENCOUNTER — Ambulatory Visit: Payer: Self-pay

## 2020-01-13 ENCOUNTER — Other Ambulatory Visit: Payer: Self-pay | Admitting: General Practice

## 2020-01-13 DIAGNOSIS — O0990 Supervision of high risk pregnancy, unspecified, unspecified trimester: Secondary | ICD-10-CM

## 2020-01-15 LAB — MATERNIT21 PLUS CORE+SCA
Fetal Fraction: 7
Monosomy X (Turner Syndrome): NOT DETECTED
Result (T21): NEGATIVE
Trisomy 13 (Patau syndrome): NEGATIVE
Trisomy 18 (Edwards syndrome): NEGATIVE
Trisomy 21 (Down syndrome): NEGATIVE
XXX (Triple X Syndrome): NOT DETECTED
XXY (Klinefelter Syndrome): NOT DETECTED
XYY (Jacobs Syndrome): NOT DETECTED

## 2020-01-16 ENCOUNTER — Encounter: Payer: Self-pay | Admitting: Family Medicine

## 2020-01-16 DIAGNOSIS — O09529 Supervision of elderly multigravida, unspecified trimester: Secondary | ICD-10-CM | POA: Insufficient documentation

## 2020-01-17 ENCOUNTER — Telehealth: Payer: Self-pay | Admitting: Genetic Counselor

## 2020-01-17 NOTE — Telephone Encounter (Signed)
LVM for Ms. Underberg re: good news about screening results. Requested a call back to my direct line to discuss these in more detail, as no identifiers were provided in voicemail message.   Gershon Crane, MS, [facility] Genetic Counselor

## 2020-01-20 ENCOUNTER — Telehealth: Payer: Self-pay | Admitting: Genetic Counselor

## 2020-01-20 NOTE — Telephone Encounter (Signed)
I called Sophia Meza to discuss her negative noninvasive prenatal screening (NIPS)/cell free DNA (cfDNA) testing result. Specifically, [name] had MaterniT21 testing through [employer]. Testing was offered because of advanced maternal age. These negative results demonstrated an expected representation of chromosome 21, 18, 13, and sex chromosome material, greatly reducing the likelihood of trisomies 27, 37, or 4 and sex chromosome aneuploidies for the pregnancy. The expected fetal sex was confirmed to be female.  NIPS analyzes placental (fetal) DNA in maternal circulation. NIPS is considered to be highly specific and sensitive, but is not considered to be a diagnostic test. We reviewed that this testing identifies 91-99% of pregnancies with trisomies 41, 65, and 66, as well as sex chromosome abnormalities, but does not test for all genetic conditions. Diagnostic testing via amniocentesis is available should she be interested in confirming this result. She confirmed that she had no questions about these results at this time.  Gershon Crane, MS, [facility] Genetic Counselor

## 2020-01-23 ENCOUNTER — Other Ambulatory Visit: Payer: Self-pay

## 2020-01-23 ENCOUNTER — Other Ambulatory Visit (HOSPITAL_COMMUNITY)
Admission: RE | Admit: 2020-01-23 | Discharge: 2020-01-23 | Disposition: A | Discharge status: Home / Self Care | Payer: Self-pay | Source: Ambulatory Visit | Attending: Obstetrics & Gynecology | Admitting: Obstetrics & Gynecology

## 2020-01-23 ENCOUNTER — Ambulatory Visit (INDEPENDENT_AMBULATORY_CARE_PROVIDER_SITE_OTHER): Payer: Self-pay | Admitting: Obstetrics & Gynecology

## 2020-01-23 VITALS — BP 123/71 | HR 64 | Wt 262.2 lb

## 2020-01-23 DIAGNOSIS — O09522 Supervision of elderly multigravida, second trimester: Secondary | ICD-10-CM

## 2020-01-23 DIAGNOSIS — O0990 Supervision of high risk pregnancy, unspecified, unspecified trimester: Secondary | ICD-10-CM

## 2020-01-23 DIAGNOSIS — O99210 Obesity complicating pregnancy, unspecified trimester: Secondary | ICD-10-CM

## 2020-01-23 NOTE — Progress Notes (Signed)
  Subjective:    Sophia Meza is a [id number] [redacted]w[redacted]d being seen today for her first obstetrical visit.  Her obstetrical history is significant for advanced maternal age and obesity. Patient does intend to breast feed. Pregnancy history fully reviewed.  Patient reports no complaints.  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 Anemia   . Preeclampsia   . Pregnancy induced hypertension    Past Surgical History:  Procedure Laterality Date  . KNEE SURGERY Right 2019  . UMBILICAL HERNIA REPAIR  2014   Family History  Problem Relation Age of Onset  . Diabetes Mother   . Diabetes Father      Exam    Uterus:   28 cm  Pelvic Exam:    Perineum:                           System:     Skin: normal coloration and turgor, no rashes    Neurologic: oriented, normal mood   Extremities: normal strength, tone, and muscle mass   HEENT PERRLA   Mouth/Teeth dental hygiene good   Neck supple   Cardiovascular: regular rate and rhythm, no murmurs or gallops   Respiratory:  appears well, vitals normal, no respiratory distress, acyanotic, normal RR, neck free of mass or lymphadenopathy, chest clear, no wheezing, crepitations, rhonchi, normal symmetric air entry   Abdomen:  gravid           Assessment:    Pregnancy: 2020 Patient Active Problem List   Diagnosis Date Noted  . AMA (advanced maternal age) multigravida 35+ 01/16/2020  . Anemia during pregnancy in second trimester 12/29/2019  . Supervision of high risk pregnancy, antepartum 12/28/2019  . Obesity in pregnancy, antepartum 12/28/2019  . History of pre-eclampsia in prior pregnancy, currently pregnant 12/28/2019  . History of birth defect 12/28/2019        Plan:     Initial labs asap, need Prenatal vitamins. Problem list reviewed and updated. Genetic Screening discussed results reviewed.  Ultrasound discussed; fetal survey: results reviewed.  Follow up in 2 weeks. 50% of 30 min visit spent on counseling and coordination of care.  Fetal echo soon   12/30/2019 01/23/2020

## 2020-01-23 NOTE — Progress Notes (Signed)
Labs rescheduled for tomorrow for GTT. Gave BP Cuff in office.

## 2020-01-23 NOTE — Patient Instructions (Signed)
Pregnancy After Age 40 Women who become pregnant after the age of 40 have a higher risk for certain problems during pregnancy. This is because older women may already have health problems before becoming pregnant. Older women who are healthy before pregnancy may still develop problems during pregnancy. These problems may affect the mother, the unborn baby (fetus), or both. What are the risks for me? If you are over age 40 and you want to become pregnant or are pregnant, you may have a higher risk of:  Not being able to get pregnant (infertility).  Going into labor early (preterm labor).  Needing surgical delivery of your baby (cesarean delivery, or C-section).  Having high blood pressure (hypertension).  Having complications during pregnancy, such as high blood pressure and other symptoms (preeclampsia).  Having diabetes during pregnancy (gestational diabetes).  Being pregnant with more than one baby.  Loss of the unborn baby before 20 weeks (miscarriage) or after 20 weeks of pregnancy (stillbirth). What are the risks for my baby? Babies born to women over the age of 40 have a higher risk for:  Being born early (prematurity).  Low birth weight, which is less than 5 lb, 8 oz (2.5 kg).  Birth defects, such as Down syndrome and cleft palate.  Health complications, including problems with growth and development. How is prenatal care different for women over age 40? All women should see their health care provider before they try to become pregnant. This is especially important for women over the age of 40. Tell your health care provider about:  Any health problems you have.  Any medicines you take.  Any family history of health problems or chromosome-related defects.  Any problems you have had with past pregnancies or deliveries. If you are over age 40 and you plan to become pregnant:  Start taking a daily multivitamin a month or more before you try to get pregnant. Your  multivitamin should contain 400 mcg (micrograms) of folic acid. If you are over age 40 and pregnant, make sure you:  Keep taking your multivitamin unless your health care provider tells you not to take it.  Keep all prenatal visits as told by your health care provider. This is important.  Have ultrasounds regularly throughout your pregnancy to check for problems.  Talk with your health care provider about other prenatal screening tests that you may need. What additional prenatal tests are needed? Screening tests show whether your baby has a higher risk for birth defects than other babies. Screening tests include:  Ultrasound tests to look for markers that indicate a risk for birth defects.  Maternal blood screening. These are blood tests that measure certain substances in your blood to determine your baby's risk for defects. Screening tests do not show whether your baby has or does not have defects. They only show your baby's risk for certain defects. If your screening tests show that risk factors are present, you may need tests to confirm the defect (diagnostic testing). These tests may include:  Chorionic villus sampling. For this procedure, a tissue sample is taken from the organ that forms in your uterus to nourish your baby (placenta). The sample is removed through your cervix or abdomen and tested.  Amniocentesis. For this procedure, a small amount of the fluid that surrounds the baby in the uterus (amniotic fluid) is removed and tested. What can I do to stay healthy during my pregnancy? Staying healthy during pregnancy can help you and your baby to have a lower risk for   problems during pregnancy, during delivery, or both. Talk with your health care provider for specific instructions about staying healthy during your pregnancy. Nutrition   At each meal, eat a variety of foods from each of the five food groups. These groups include: ? Proteins such as lean meats, poultry, fish that is  low in fat, beans, eggs, and nuts. ? Vegetables such as leafy greens, raw and cooked vegetables, and vegetable juice. ? Fruits that are fresh, frozen, or canned, or 100% fruit juice. ? Dairy products such as low-fat yogurt, cheese, and milk. ? Whole grains including rice, cereal, pasta, and bread.  Talk with your health care provider about how much food in each group is right for you.  Follow instructions from your health care provider about eating and drinking restrictions during pregnancy. ? Do not eat raw eggs, raw meat, or raw fish or seafood. ? Do not eat any fish that contains high amounts of mercury, such as swordfish or mackerel.  Drink 6-8 or more glasses of water a day. You should drink enough fluid to keep your urine pale yellow. Managing weight gain  Ask your health care provider how much weight gain is healthy during pregnancy.  Stay at a healthy weight. If needed, work with your health care provider to lose weight safely. Activity  Exercise regularly, as directed by your health care provider. Ask your health care provider what forms of exercise are safe for you. General instructions  Do not use any products that contain nicotine or tobacco, such as cigarettes and e-cigarettes. If you need help quitting, ask your health care provider.  Do not drink alcohol, use drugs, or abuse prescription medicine.  Take over-the-counter and prescription medicines only as told by your health care provider.  Do not use hot tubs, steam rooms, or saunas.  Talk with your health care provider about your risk of exposure to harmful environmental conditions. This includes exposure to chemicals, radiation, cleaning products, and cat feces. Follow advice from your health care provider about how to limit your exposure. Summary  Women who become pregnant after the age of 40 have a higher risk for complications during pregnancy.  Problems may affect the mother, the unborn baby (fetus), or  both.  All women should see their health care provider before they try to become pregnant. This is especially important for women over the age of 40.  Staying healthy during pregnancy can help both you and your baby to have a lower risk for some of the problems that can happen during pregnancy, during delivery, or both. This information is not intended to replace advice given to you by your health care provider. Make sure you discuss any questions you have with your health care provider. Document Revised: 07/16/2018 Document Reviewed: 07/14/2016 Elsevier Patient Education  2020 [employer] Inc.  

## 2020-01-24 ENCOUNTER — Other Ambulatory Visit: Payer: Self-pay

## 2020-01-24 LAB — GC/CHLAMYDIA PROBE AMP (~~LOC~~) NOT AT ARMC
Chlamydia: NEGATIVE
Comment: NEGATIVE
Comment: NORMAL
Neisseria Gonorrhea: NEGATIVE

## 2020-01-25 LAB — URINE CULTURE, OB REFLEX

## 2020-01-25 LAB — CULTURE, OB URINE

## 2020-02-06 ENCOUNTER — Encounter: Payer: Self-pay | Admitting: Obstetrics and Gynecology

## 2020-02-07 ENCOUNTER — Telehealth: Payer: Self-pay | Admitting: Family Medicine

## 2020-02-07 ENCOUNTER — Encounter: Payer: Self-pay | Admitting: Obstetrics & Gynecology

## 2020-02-07 NOTE — Telephone Encounter (Signed)
LVM with pt to advise of new HOB appt 02-08-20 prior to ULT...km

## 2020-02-08 ENCOUNTER — Encounter: Payer: Self-pay | Admitting: Obstetrics & Gynecology

## 2020-02-08 ENCOUNTER — Ambulatory Visit: Payer: Self-pay | Attending: Obstetrics and Gynecology

## 2020-02-08 ENCOUNTER — Ambulatory Visit: Payer: Self-pay

## 2020-02-09 ENCOUNTER — Telehealth: Payer: Self-pay | Admitting: Obstetrics and Gynecology

## 2020-02-09 NOTE — Telephone Encounter (Signed)
Attempted to reach patient via telephone to inform her of her appointment change. Left a voicemail message for patient to call if this did not work for her.

## 2020-02-16 ENCOUNTER — Encounter: Payer: Self-pay | Admitting: Obstetrics and Gynecology

## 2020-03-19 ENCOUNTER — Encounter: Payer: Self-pay | Admitting: Obstetrics and Gynecology

## 2020-03-19 ENCOUNTER — Ambulatory Visit (INDEPENDENT_AMBULATORY_CARE_PROVIDER_SITE_OTHER): Payer: Self-pay | Admitting: Obstetrics and Gynecology

## 2020-03-19 ENCOUNTER — Other Ambulatory Visit: Payer: Self-pay

## 2020-03-19 VITALS — BP 124/82 | HR 72 | Wt 263.1 lb

## 2020-03-19 DIAGNOSIS — O0990 Supervision of high risk pregnancy, unspecified, unspecified trimester: Secondary | ICD-10-CM

## 2020-03-19 DIAGNOSIS — O09529 Supervision of elderly multigravida, unspecified trimester: Secondary | ICD-10-CM

## 2020-03-19 DIAGNOSIS — O26893 Other specified pregnancy related conditions, third trimester: Secondary | ICD-10-CM

## 2020-03-19 DIAGNOSIS — Z7189 Other specified counseling: Secondary | ICD-10-CM

## 2020-03-19 DIAGNOSIS — O09293 Supervision of pregnancy with other poor reproductive or obstetric history, third trimester: Secondary | ICD-10-CM

## 2020-03-19 DIAGNOSIS — Z23 Encounter for immunization: Secondary | ICD-10-CM

## 2020-03-19 DIAGNOSIS — O99891 Other specified diseases and conditions complicating pregnancy: Secondary | ICD-10-CM

## 2020-03-19 DIAGNOSIS — O09299 Supervision of pregnancy with other poor reproductive or obstetric history, unspecified trimester: Secondary | ICD-10-CM

## 2020-03-19 DIAGNOSIS — Z87798 Personal history of other (corrected) congenital malformations: Secondary | ICD-10-CM

## 2020-03-19 DIAGNOSIS — Z3009 Encounter for other general counseling and advice on contraception: Secondary | ICD-10-CM

## 2020-03-19 DIAGNOSIS — R12 Heartburn: Secondary | ICD-10-CM

## 2020-03-19 DIAGNOSIS — O99013 Anemia complicating pregnancy, third trimester: Secondary | ICD-10-CM

## 2020-03-19 DIAGNOSIS — Z3A34 34 weeks gestation of pregnancy: Secondary | ICD-10-CM

## 2020-03-19 DIAGNOSIS — O99210 Obesity complicating pregnancy, unspecified trimester: Secondary | ICD-10-CM

## 2020-03-19 DIAGNOSIS — O99213 Obesity complicating pregnancy, third trimester: Secondary | ICD-10-CM

## 2020-03-19 DIAGNOSIS — O09523 Supervision of elderly multigravida, third trimester: Secondary | ICD-10-CM

## 2020-03-19 DIAGNOSIS — D649 Anemia, unspecified: Secondary | ICD-10-CM

## 2020-03-19 DIAGNOSIS — O99012 Anemia complicating pregnancy, second trimester: Secondary | ICD-10-CM

## 2020-03-19 DIAGNOSIS — E669 Obesity, unspecified: Secondary | ICD-10-CM

## 2020-03-19 LAB — POCT URINALYSIS DIP (DEVICE)
Bilirubin Urine: NEGATIVE
Glucose, UA: NEGATIVE mg/dL
Hgb urine dipstick: NEGATIVE
Ketones, ur: NEGATIVE mg/dL
Nitrite: NEGATIVE
Protein, ur: NEGATIVE mg/dL
Specific Gravity, Urine: 1.025 (ref 1.005–1.030)
Urobilinogen, UA: 0.2 mg/dL (ref 0.0–1.0)
pH: 7 (ref 5.0–8.0)

## 2020-03-19 MED ORDER — FAMOTIDINE 20 MG PO TABS: 20.0000 mg | ORAL_TABLET | Freq: Two times a day (BID) | ORAL | 3 refills | Status: DC

## 2020-03-19 MED ORDER — DOCUSATE SODIUM 100 MG PO CAPS: 100.0000 mg | ORAL_CAPSULE | Freq: Two times a day (BID) | ORAL | 0 refills | Status: DC

## 2020-03-19 NOTE — Progress Notes (Addendum)
PRENATAL VISIT NOTE  Subjective:  Sophia Meza is a 39 y.o. O2V0350 at [redacted]w[redacted]d being seen today for ongoing prenatal care.  She is currently monitored for the following issues for this high-risk pregnancy and has Supervision of high risk pregnancy, antepartum; Obesity in pregnancy, antepartum; History of pre-eclampsia in prior pregnancy, currently pregnant; History of birth defect; Anemia during pregnancy in second trimester; AMA (advanced maternal age) multigravida 35+; and Heartburn on their problem list.  Patient reports heart burn, taking pepcid and tums at home.  Contractions: Irregular. Vag. Bleeding: None.  Movement: Present. Denies leaking of fluid.   The following portions of the patient's history were reviewed and updated as appropriate: allergies, current medications, past family history, past medical history, past social history, past surgical history and problem list.   Objective:   Vitals:   03/19/20 0944  BP: 124/82  Pulse: 72  Weight: 263 lb 1.6 oz (119.3 kg)    Fetal Status: Fetal Heart Rate (bpm): 131 Fundal Height: 33 cm Movement: Present     General:  Alert, oriented and cooperative. Patient is in no acute distress.  Skin: Skin is warm and dry. No rash noted.   Cardiovascular: Normal heart rate noted  Respiratory: Normal respiratory effort, no problems with respiration noted  Abdomen: Soft, gravid, appropriate for gestational age.  Pain/Pressure: Present     Pelvic: Cervical exam deferred        Extremities: Normal range of motion.  Edema: Trace  Mental Status: Normal mood and affect. Normal behavior. Normal judgment and thought content.   Assessment and Plan:  Pregnancy: [id number] at [redacted]w[redacted]d  1. Supervision of high risk pregnancy, antepartum - pt agreeable to Tdap, declines Flu - docusate sodium (COLACE) 100 MG capsule; Take 1 capsule (100 mg total) by mouth 2 (two) times daily.  Dispense: 60 capsule; Refill: 0 - CBC/D/Plt+RPR+Rh+ABO+Rub Ab... - Glucose  tolerance, 1 hour - Comprehensive metabolic panel  2. Obesity in pregnancy, antepartum - CBC/D/Plt+RPR+Rh+ABO+Rub Ab... - Glucose tolerance, 1 hour  3. History of pre-eclampsia in prior pregnancy, currently pregnant - CBC/D/Plt+RPR+Rh+ABO+Rub Ab... - Glucose tolerance, 1 hour  4. History of birth defect  5. Anemia during pregnancy in second trimester  6. Antepartum multigravida of advanced maternal age Weekly testing starting 36 weeks Has f/u growth 12/22  7. [redacted] weeks gestation of pregnancy  9. Counseled about COVID-19 virus infection COVID-19 Vaccine Counseling: The patient was counseled on the potential benefits and lack of known risks of COVID vaccination, during pregnancy and breastfeeding, during today's visit. The patient's questions and concerns were addressed today, including safety of the vaccination and potential side effects as they have been published by ACOG and SMFM. The patient has been informed that there have not been any documented vaccine related injuries, deaths or birth defects to infant or mom after receiving the COVID-19 vaccine to date. The patient has been made aware that although she is not at increased risk of contracting COVID-19 during pregnancy, she is at increased risk of developing severe disease and complications if she contracts COVID-19 while pregnant. All patient questions were addressed during our visit today. The patient is not planning to get vaccinated at this time.   8. Heartburn during pregnancy in third trimester pepcid Reviewed staying upright after eating and elevating head of bed  10. Unwanted fertility BTL papers signed today   Preterm labor symptoms and general obstetric precautions including but not limited to vaginal bleeding, contractions, leaking of fluid and fetal movement were reviewed in detail  with the patient. Please refer to After Visit Summary for other counseling recommendations.   Return in about 2 weeks (around  04/02/2020) for 36 week swabs, high OB, in person.  Future Appointments  Date Time Provider Department Center  03/28/2020  3:30 PM [facility] NURSE [facility]  03/28/2020  3:45 PM [facility]    Conan Bowens, MD

## 2020-03-19 NOTE — Progress Notes (Signed)
C/o hemorrhoids. Recommended treating hemorrhoids with appropriate over the counter meds. C/o  Hard stools, offered stool softener per protocol and RX sent in per patient request. C/o lower back pain. Missed 28 wk glucose testing. Will reschedule at checkout. Declines flu, tdap and covid vaccines. Rozlynn Lippold,RN

## 2020-03-19 NOTE — Progress Notes (Signed)
Decided to take tdap, tdap given. 1 hour gluocose completed per order. Ulanda Tackett,RN

## 2020-03-19 NOTE — Patient Instructions (Signed)
AREA PEDIATRIC/FAMILY PRACTICE PHYSICIANS  [place] ([address]) . [facility] o Chambliss, MD; Eniola, MD; Hale, MD; Hensel, MD; McDiarmid, MD; McIntyer, MD; Neal, MD; Walden, MD o [address] [place], [place] [address] o [phone] o Mon-Fri 8:30-12:30, 1:30-5:00 o Providers come to see babies at [facility] o Accepting Medicaid . [facility] o Limited providers who accept newborns: Koirala, MD; Morrow, MD; Wolters, MD o [address], [place], [place] [address] o [phone] o Mon-Fri 8:00-5:30 o Babies seen by providers at [facility] o Does NOT accept Medicaid o Please call early in hospitalization for appointment (limited availability)  . [facility] o Mulberry, MD o [address] [place], [place] [address] o [phone] o Mon, Tue, Thur, Fri 8:30-5:00, Wed 10:00-7:00 (closed 1-2pm) o Babies seen by [facility] providers o Accepting Medicaid . [facility] o Rubin, MD o [address], [place], [place] [address] o [phone] o Mon-Fri 8:30-5:00, Sat 8:30-12:00 o Provider comes to see babies at [facility] o Accepting Medicaid o Must have been referred from current patients or contacted office prior to delivery . [facility] ([facility]) o Brown, MD; Chandler, MD; Ettefagh, MD; Grant, MD; Lester, MD; McCormick, MD; McQueen, MD; Prose, MD; Simha, MD; Stanley, MD; Stryffeler, NP; Tebben, NP o [address], [place], [place] [address] o [phone] o Mon, Tue, Thur, Fri 8:30-5:30, Wed 9:30-5:30, Sat 8:30-12:30 o Babies seen by [facility] providers o Accepting Medicaid o Only accepting infants of first-time parents or siblings of current patients o Hospital discharge coordinator will make follow-up appointment . Jack Amos o [address],  [place], [place]  [address] o [phone]   Fax - [phone] . [facility] o [address], [place], [place]  [address] o Phone - [phone]   Fax - [phone] . Shilpa Gosrani o [address], [place], [place]  [address] o [phone]  [place] ([address]) . [facility] o Bates, MD; [place], MD; Cooper, Cox, MD; MD; Davis, MD; Dovico, MD; Ettefaugh, MD; Little, MD; Lowe, MD; Keiffer, MD; Melvin, MD; Sumner, MD; Williams, MD o [address], [place], [place] [address] o [phone] o Mon-Fri 8:30-5:00 (extended evenings Mon-Thur as needed), Sat-Sun 10:00-1:00 o Providers come to see babies at [facility] o Accepting Medicaid for families of first-time babies and families with all children in the household age 3 and under. Must register with office prior to making appointment (M-F only). . [facility] o Henson, NP; Knapp, MD; Lalonde, MD; Tysinger, PA o [address] [place], [place] [address] o [phone] o Mon-Fri 8:00-5:00 o Babies seen by providers at [facility] o Does NOT accept Medicaid/Commercial Insurance Only . [facility] ([facility])  o Artis, MD; Barnes, MD; Bratton, MD; Coccaro, MD; Lockett Gardner, MD; Kramer, MD; Marshall, MD; Netherton, MD; Poleto, MD; Skinner, MD o [address] [place], [place] [address] o [phone] o Mon-Fri 8:30-5:30, Sat (Oct.-Mar.) 9:00-1:00 o Babies seen by providers at [facility] o Accepting Medicaid  [place] ([address]) . [facility]  o Reid, MD; Warner, MD o [address], ,  [address] o [phone] o Mon-Fri 8:30-5:00, Sat 8:30-12:00 o Providers come to see babies at [facility] o Does NOT accept Medicaid . [facility] o Becker, PA; Hagler, MD; Scifres, PA; Sun, MD; Swayne, MD o [address],  [place], [place] [address] o [phone] o Mon-Fri 8:00-5:00 o Babies seen by providers at [facility] o Does NOT accept Medicaid o Only accepting babies of parents who are patients o Please call early in hospitalization for appointment (limited availability) . [facility] o Clark, MD; Frye, MD; Kelleher, MD; Mack, NP; Miller, MD; O'Keller, MD; Patterson, NP; Pudlo, MD; Puzio, MD; Thomas, MD; Tucker, MD; Twiselton, MD o [address], [place], [place] [address] o [phone] o Mon-Fri 8:00-5:00, Sat 9:00-12:00 o Providers come to see babies at [facility] o Does NOT accept Medicaid  [place] ([address]) . [facility] o Limited providers accepting new patients: Brake, NP; Wharton, PA o [address], [place], [place] [address] o [phone] o Mon-Fri 8:00-5:00 o Babies seen by providers at [facility] o Does NOT accept Medicaid o Only accepting babies of parents who are patients o Please call early in hospitalization for appointment (limited availability) . [facility] o Gay, MD; Quinlan, MD o [address] [place], [place] [address] o [phone] (press 1 to schedule appointment) o Mon-Fri 8:00-5:00 o Providers come to see babies at [facility] o Does NOT accept Medicaid . [facility] o Mazer, MD o [address] [place], [place] [address] o [phone] o Mon-Fri 8:30-5:00 (lunch 12:30-1:00), extended hours by appointment only Wed 5:00-6:30 o Babies seen by [facility] providers o Accepting Medicaid . [facility] o Banks, MD; Jordan, MD; Koberlein, MD o [address], [place], [place] [address] o [phone] o Mon-Fri 8:00-5:00 o Babies seen by [facility] providers o Does NOT accept Medicaid . [facility] o Parker, MD; Hunter, MD; Wallace, DO o [address] [place], [place]  [address] o [phone] o Mon-Fri 8:00-5:00 o Babies seen by [facility] providers o Does NOT accept Medicaid . [facility] o Brandon, PA; Brecken, PA; Christy, NP; Dees, MD; DeClaire, MD; DeWeese, MD; Hansen, NP; Mills, NP; Parrish, NP; Smoot, NP; Summer, MD; Vapne, MD o [address] [place], [place] [address] o [phone] o Mon-Fri 8:30-5:00, Sat 10:00-1:00 o Providers come to see babies at [facility] o Does NOT accept Medicaid o Free prenatal information session Tuesdays at 4:45pm . [facility] o Bouska, MD; Gordon, PA; Jeffery, PA; Weber, PA o [address] [place] [address] o [phone] o Mon-Fri 7:30-5:30 o Babies seen by [facility] providers . [facility] o [address], [place], [place]  [address] o [phone]   Fax - [phone]  [place] ([address] & [address]) . [facility] o [place], [place] o [address] [place], [place] [address] o [phone] o Mon-Thur 8:00-6:00 o Providers come to see babies at [facility] o Accepting Medicaid . [facility] o Anderson, NP; Badger, MD; Beal, PA; Spencer, PA o [address] [place], [place] [address] o [phone] o Mon-Thur 7:30-7:30, Fri 7:30-4:30 o Babies seen by [facility] providers o Accepting Medicaid . [facility] o Agbuya, MD; Klett, NP; Romgoolam, MD o [address], [place], [place] [address] o [phone] o Mon-Fri 8:30-5:00, Sat 8:30-12:00 o Providers come to see babies at [facility] o Accepting Medicaid o Must have "Meet & Greet" appointment at office prior to delivery . [facility] o McCord,   MD; Wallace, MD; Wood, MD o [address], [place], [place] [address] o [phone] o Mon-Wed 8:00-6:00, Thur-Fri 8:00-5:00, Sat 9:00-12:00 o Providers come to  see babies at [facility] o Does NOT accept Medicaid o Only accepting siblings of current patients . [facility]  o [address], [place], [place]  [address] o [phone]   Fax - [phone] . [facility] o [address], [place], [place]  [address] o [phone]   Fax - [phone]  [place] ([address] & [address]) . [facility] o Cirigliano, DO; Matthews, DO o [address] [place], [place] [address] o [phone] o Mon-Fri 7:00-5:00 o Babies seen by [facility] providers o Does NOT accept Medicaid . [facility] o Briscoe, MD; Howley, PA; Moreira, PA o [address], [place], [place] [address] o [phone] o Mon-Fri 8:00-5:00 o Babies seen by [facility] providers o Accepting Medicaid . [facility] o Boyd, MD; Church, PA; Jones, NP; Osborn, PA o [address], [place], [place] [address] o [phone] o Mon-Fri 8:00-5:00 o Babies seen by providers at [facility] o Accepting Medicaid  [place] ([address]) . [facility] [place] o Wendling, DO o [address] [place], [place] [address] o [phone] o Mon-Fri 8:00-5:00 o Babies seen by [facility] providers o Does NOT accept Medicaid o Limited availability, please call early in hospitalization to schedule follow-up . [facility] o Calderon, PA; Cummings, MD; Dillard, MD; Martin, PA; Olson, MD; VanDeven, PA o [address], [place], [place] [address] o [phone] o Mon-Fri 8:30-5:00, Sat 9:00-12:00 o Babies seen by providers at [facility] o Accepting Medicaid o Please register online then schedule online or call office o [email or web address] . [facility] ([facility] o Hunter, NP; Kumar, MD; Martin Rogers, PA o [address], [place], [place] [address] o [phone] o Mon-Fri 8:00-5:00 o Babies seen by providers at [facility] o Accepting Medicaid . [facility] ([facility] o Al[place], MD; Kristi Fleenor, NP; West, MD o [address], [place], [place] [address] o [phone] o Mon-Fri 8:00-5:30, Sat&Sun by appointment (phones open at 8:30) o Babies seen by [facility] providers o Accepting Medicaid o Must be a first-time baby or sibling of current patient . [facility] [place]  o [address], [place], [place]  [address] o [phone]   Fax - [phone]  [place] ([address]) . [facility] o Brown, PA; Cowen, PA; Rice, MD; Helton, PA; Spry, MD o [address] [place], [place] [address] o [phone] o Mon-Thur 8:00-7:00, Fri 8:00-5:00, Sat 8:00-12:00, Sun 9:00-12:00 o Babies seen by [facility] providers o Accepting Medicaid . [facility] o Coe-Goins, MD; [place], MD; Pierre-Louis, MD o [address] [place] [address], [place], [place] [address] o [phone] o Mon-Thur 8:00-5:00 o Babies seen by providers at [facility] o Accepting Medicaid . [facility] o Bratton, MD; Coe-Goins, MD; Hayes, MD; Lewis, MD; List, MD; Lott, MD; [place], MD; Moran, MD; O'Neal, MD; Pierre-Louis, MD; Pitonzo, MD; Scholer, MD; Spangle, MD o [address] [place],    [address] o [phone] o Mon-Fri 8:00-5:30, Sat (Oct.-Mar.) 9:00-1:00 o Babies seen by providers at [facility] o Accepting Medicaid o Must fill out new patient packet, available online at [email or web address] . [facility] ([facility] o Friddle, NP; Harris, NP; Kelly, NP; Logan, MD;  Melvin, PA; Poth, MD; Ramadoss, MD; [place], NP o [address], [place], [place] [address] o [phone] o Mon-Thur 8:00-5:30, Fri 8:00-5:00 o Babies seen by providers at [facility] o Accepting Medicaid  [place] ([address]) . [facility] o Dixon, PA; [place], MD; Pickard, MD; Tapia, PA o [address], [place], [place] [address] o [phone] o Mon-Fri 8:00-5:00 o Babies seen by providers at [facility] o Accepting Medicaid   [place] ([address]) . [facility] [place] o Masneri, DO; Meyers, MD; Nelson, PA o [address], [place], [place] [address] o [phone] o Mon-Fri 8:00-5:00 o Babies seen by providers at [facility] o Does NOT accept Medicaid o Limited appointment availability, please call early in hospitalization  . [facility] [place] o Kunedd, DO; McGowen, MD o [address], [place], [place] [address] o [phone] o Mon-Fri 8:00-5:00 o Babies seen by [facility] providers o Does NOT accept Medicaid . [facility] [place] o Cameron, MD; MacDonald, MD; Michaels, PA; Nayak, MD o [address] [place] [address], [place], [place] [address] o [phone] o Mon-Fri 8:00-5:00 o After hours clinic ([address] [place], [place] [address]) [phone] Mon-Fri 5:00-8:00, Sat 12:00-6:00, Sun 10:00-4:00 o Babies seen by [facility] providers o Accepting Medicaid . [facility] [place] o [address], [place], [place]  [address] o [phone]   Fax - [phone]  [place] ([address]) .  [facility] [place] [facility] o Andy, MD o [address], [place], [place] [address] o [phone] o Mon-Fri 8:00-5:00 o Babies seen by [facility] providers o Does NOT accept Medicaid . [facility] [place] ([facility] [place]) o Eksir, MD o [address], [place], [place]  [address] o [phone] o Mon-Thur 8:00-7:00, Fri 8:00-5:00, Sat 8:00-12:00 o Babies seen by providers at [facility] o Accepting Medicaid - but does not have vaccinations in office (must be received elsewhere) o Limited availability, please call early in hospitalization  [place] ([address]) . [facility]  o Charlene Flemming, MD o [address], [place] [address] o [phone]  Fax [phone]   

## 2020-03-20 LAB — CBC/D/PLT+RPR+RH+ABO+RUB AB...
Antibody Screen: NEGATIVE
Basophils Absolute: 0 10*3/uL (ref 0.0–0.2)
Basos: 0 %
EOS (ABSOLUTE): 0 10*3/uL (ref 0.0–0.4)
Eos: 0 %
HCV Ab: <0.1 s/co ratio (ref 0.0–0.9)
HIV Screen 4th Generation wRfx: NONREACTIVE
Hematocrit: 32.9 % — ABNORMAL LOW (ref 34.0–46.6)
Hemoglobin: 10.9 g/dL — ABNORMAL LOW (ref 11.1–15.9)
Hepatitis B Surface Ag: NEGATIVE
Immature Grans (Abs): 0.1 10*3/uL (ref 0.0–0.1)
Immature Granulocytes: 1 %
Lymphocytes Absolute: 2.2 10*3/uL (ref 0.7–3.1)
Lymphs: 28 %
MCH: 27 pg (ref 26.6–33.0)
MCHC: 33.1 g/dL (ref 31.5–35.7)
MCV: 81 fL (ref 79–97)
Monocytes Absolute: 0.6 10*3/uL (ref 0.1–0.9)
Monocytes: 8 %
Neutrophils Absolute: 5 10*3/uL (ref 1.4–7.0)
Neutrophils: 63 %
Platelets: 241 10*3/uL (ref 150–450)
RBC: 4.04 x10E6/uL (ref 3.77–5.28)
RDW: 13.4 % (ref 11.7–15.4)
RPR Ser Ql: NONREACTIVE
Rh Factor: POSITIVE
Rubella Antibodies, IGG: 3.42 index (ref >0.99)
WBC: 7.9 10*3/uL (ref 3.4–10.8)

## 2020-03-20 LAB — COMPREHENSIVE METABOLIC PANEL
ALT: 12 IU/L (ref 0–32)
AST: 11 IU/L (ref 0–40)
Albumin/Globulin Ratio: 1.6 (ref 1.2–2.2)
Albumin: 3.6 g/dL — ABNORMAL LOW (ref 3.8–4.8)
Alkaline Phosphatase: 78 IU/L (ref 44–121)
BUN/Creatinine Ratio: 16 (ref 9–23)
BUN: 8 mg/dL (ref 6–20)
Bilirubin Total: <0.2 mg/dL (ref 0.0–1.2)
CO2: 19 mmol/L — ABNORMAL LOW (ref 20–29)
Calcium: 8.6 mg/dL — ABNORMAL LOW (ref 8.7–10.2)
Chloride: 104 mmol/L (ref 96–106)
Creatinine, Ser: 0.5 mg/dL — ABNORMAL LOW (ref 0.57–1.00)
GFR calc Af Amer: 141 mL/min/{1.73_m2} (ref >59)
GFR calc non Af Amer: 122 mL/min/{1.73_m2} (ref >59)
Globulin, Total: 2.3 g/dL (ref 1.5–4.5)
Glucose: 130 mg/dL — ABNORMAL HIGH (ref 65–99)
Potassium: 3.9 mmol/L (ref 3.5–5.2)
Sodium: 136 mmol/L (ref 134–144)
Total Protein: 5.9 g/dL — ABNORMAL LOW (ref 6.0–8.5)

## 2020-03-20 LAB — HCV INTERPRETATION

## 2020-03-20 LAB — GLUCOSE TOLERANCE, 1 HOUR: Glucose, 1Hr PP: 126 mg/dL (ref 65–199)

## 2020-03-21 ENCOUNTER — Encounter: Payer: Self-pay | Admitting: *Deleted

## 2020-03-28 ENCOUNTER — Ambulatory Visit: Payer: Self-pay | Attending: Obstetrics and Gynecology | Admitting: *Deleted

## 2020-03-28 ENCOUNTER — Ambulatory Visit: Payer: MEDICAID

## 2020-03-28 ENCOUNTER — Encounter: Payer: Self-pay | Admitting: *Deleted

## 2020-03-28 ENCOUNTER — Ambulatory Visit (HOSPITAL_BASED_OUTPATIENT_CLINIC_OR_DEPARTMENT_OTHER): Payer: Self-pay

## 2020-03-28 ENCOUNTER — Other Ambulatory Visit: Payer: Self-pay

## 2020-03-28 DIAGNOSIS — Z3A35 35 weeks gestation of pregnancy: Secondary | ICD-10-CM | POA: Insufficient documentation

## 2020-03-28 DIAGNOSIS — O09523 Supervision of elderly multigravida, third trimester: Secondary | ICD-10-CM

## 2020-03-28 DIAGNOSIS — O0933 Supervision of pregnancy with insufficient antenatal care, third trimester: Secondary | ICD-10-CM

## 2020-03-28 DIAGNOSIS — E669 Obesity, unspecified: Secondary | ICD-10-CM | POA: Insufficient documentation

## 2020-03-28 DIAGNOSIS — O99213 Obesity complicating pregnancy, third trimester: Secondary | ICD-10-CM | POA: Insufficient documentation

## 2020-03-28 DIAGNOSIS — O09299 Supervision of pregnancy with other poor reproductive or obstetric history, unspecified trimester: Secondary | ICD-10-CM

## 2020-03-28 DIAGNOSIS — Z87798 Personal history of other (corrected) congenital malformations: Secondary | ICD-10-CM

## 2020-03-28 DIAGNOSIS — O09529 Supervision of elderly multigravida, unspecified trimester: Secondary | ICD-10-CM

## 2020-03-28 DIAGNOSIS — Z363 Encounter for antenatal screening for malformations: Secondary | ICD-10-CM

## 2020-03-28 DIAGNOSIS — O09293 Supervision of pregnancy with other poor reproductive or obstetric history, third trimester: Secondary | ICD-10-CM

## 2020-03-28 DIAGNOSIS — O0990 Supervision of high risk pregnancy, unspecified, unspecified trimester: Secondary | ICD-10-CM

## 2020-03-28 DIAGNOSIS — O352XX0 Maternal care for (suspected) hereditary disease in fetus, not applicable or unspecified: Secondary | ICD-10-CM

## 2020-03-28 DIAGNOSIS — O99210 Obesity complicating pregnancy, unspecified trimester: Secondary | ICD-10-CM

## 2020-04-03 ENCOUNTER — Other Ambulatory Visit (HOSPITAL_COMMUNITY)
Admission: RE | Admit: 2020-04-03 | Discharge: 2020-04-03 | Disposition: A | Discharge status: Home / Self Care | Payer: Self-pay | Source: Ambulatory Visit | Attending: Family Medicine | Admitting: Family Medicine

## 2020-04-03 ENCOUNTER — Ambulatory Visit (INDEPENDENT_AMBULATORY_CARE_PROVIDER_SITE_OTHER): Payer: Self-pay | Admitting: Family Medicine

## 2020-04-03 ENCOUNTER — Other Ambulatory Visit: Payer: Self-pay

## 2020-04-03 VITALS — BP 112/74 | HR 79 | Wt 262.7 lb

## 2020-04-03 DIAGNOSIS — O09523 Supervision of elderly multigravida, third trimester: Secondary | ICD-10-CM

## 2020-04-03 DIAGNOSIS — O09299 Supervision of pregnancy with other poor reproductive or obstetric history, unspecified trimester: Secondary | ICD-10-CM

## 2020-04-03 DIAGNOSIS — Z87798 Personal history of other (corrected) congenital malformations: Secondary | ICD-10-CM

## 2020-04-03 DIAGNOSIS — O99012 Anemia complicating pregnancy, second trimester: Secondary | ICD-10-CM

## 2020-04-03 DIAGNOSIS — O0990 Supervision of high risk pregnancy, unspecified, unspecified trimester: Secondary | ICD-10-CM | POA: Insufficient documentation

## 2020-04-03 DIAGNOSIS — O99210 Obesity complicating pregnancy, unspecified trimester: Secondary | ICD-10-CM

## 2020-04-03 NOTE — Progress Notes (Signed)
   Subjective:  Sophia Meza is a 39 y.o. [id number] at [redacted]w[redacted]d being seen today for ongoing prenatal care.  She is currently monitored for the following issues for this high-risk pregnancy and has Supervision of high risk pregnancy, antepartum; Obesity in pregnancy, antepartum; History of pre-eclampsia in prior pregnancy, currently pregnant; History of birth defect; Anemia during pregnancy in second trimester; AMA (advanced maternal age) multigravida 35+; and Heartburn on their problem list.  Patient reports no complaints.  Contractions: Not present. Vag. Bleeding: None.  Movement: Present. Denies leaking of fluid.   The following portions of the patient's history were reviewed and updated as appropriate: allergies, current medications, past family history, past medical history, past social history, past surgical history and problem list. Problem list updated.  Objective:   Vitals:   04/03/20 1416  BP: 112/74  Pulse: 79  Weight: 262 lb 11.2 oz (119.2 kg)    Fetal Status: Fetal Heart Rate (bpm): 140   Movement: Present     General:  Alert, oriented and cooperative. Patient is in no acute distress.  Skin: Skin is warm and dry. No rash noted.   Cardiovascular: Normal heart rate noted  Respiratory: Normal respiratory effort, no problems with respiration noted  Abdomen: Soft, gravid, appropriate for gestational age. Pain/Pressure: Present     Pelvic: Vag. Bleeding: None     Cervical exam deferred        Extremities: Normal range of motion.  Edema: None  Mental Status: Normal mood and affect. Normal behavior. Normal judgment and thought content.   Urinalysis:      Assessment and Plan:  Pregnancy: [id number] at [redacted]w[redacted]d  1. Supervision of high risk pregnancy, antepartum FHR and BP normal Swabs today Was feeling unwell 2 days ago but feels fine now, had mild cough, was exposed to a baby that had a cough, no one with confirmed COVID she knows of Signed BTL papers on 03/19/2020 - Culture, beta  strep (group b only) - GC/Chlamydia probe amp ([place])not at [facility]  2. Obesity in pregnancy, antepartum   3. History of pre-eclampsia in prior pregnancy, currently pregnant BP normal this pregnancy  4. History of birth defect Hypoplastic L heart, passed at 69 months old Has not yet obtained fetal echo this pregnancy, makes her very anxious due to the death of her last child Discussed importance of completing this study if possible, primarily due to the fact that an undiagnosed heart condition could lead to catastrophic complications immediately post-delivery She will try to schedule it asap  5. Multigravida of advanced maternal age in third trimester Turned 29 earlier this month, technically meeting our criteria for antenatal testing at this point Will schedule for BPP next week Today unable to stay for NST due to transportation issues  6. Anemia during pregnancy in second trimester Mild, hgb 10.9 last check  Preterm labor symptoms and general obstetric precautions including but not limited to vaginal bleeding, contractions, leaking of fluid and fetal movement were reviewed in detail with the patient. Please refer to After Visit Summary for other counseling recommendations.  Return in 1 week (on 04/10/2020).   Venora Maples, MD

## 2020-04-03 NOTE — Patient Instructions (Signed)
Group B Streptococcus Infection During Pregnancy Group B Streptococcus (GBS) is a type of bacteria that is often found in healthy people. It is commonly found in the rectum, vagina, and intestines. In people who are healthy and not pregnant, the bacteria rarely cause serious illness or complications. However, women who test positive for GBS during pregnancy can pass the bacteria to the baby during childbirth. This can cause serious infection in the baby after birth. Women with GBS may also have infections during their pregnancy or soon after childbirth. The infections include urinary tract infections (UTIs) or infections of the uterus. GBS also increases a woman's risk of complications during pregnancy, such as early labor or delivery, miscarriage, or stillbirth. Routine testing for GBS is recommended for all pregnant women. What are the causes? This condition is caused by bacteria called Streptococcus agalactiae. What increases the risk? You may have a higher risk for GBS infection during pregnancy if you had one during a past pregnancy. What are the signs or symptoms? In most cases, GBS infection does not cause symptoms in pregnant women. If symptoms exist, they may include:  Labor that starts before the 37th week of pregnancy.  A UTI or bladder infection. This may cause a fever, frequent urination, or pain and burning during urination.  Fever during labor. There can also be a rapid heartbeat in the mother or baby. Rare but serious symptoms of a GBS infection in women include:  Blood infection (septicemia). This may cause fever, chills, or confusion.  Lung infection (pneumonia). This may cause fever, chills, cough, rapid breathing, chest pain, or difficulty breathing.  Bone, joint, skin, or soft tissue infection. How is this diagnosed? You may be screened for GBS between week 35 and week 37 of pregnancy. If you have symptoms of preterm labor, you may be screened earlier. This condition  is diagnosed based on lab test results from:  A swab of fluid from the vagina and rectum.  A urine sample. How is this treated? This condition is treated with antibiotic medicine. Antibiotic medicine may be given:  To you when you go into labor, or as soon as your water breaks. The medicines will continue until after you give birth. If you are having a cesarean delivery, you do not need antibiotics unless your water has broken.  To your baby, if he or she requires treatment. Your health care provider will check your baby to decide if he or she needs antibiotics to prevent a serious infection. Follow these instructions at home:  Take over-the-counter and prescription medicines only as told by your health care provider.  Take your antibiotic medicine as told by your health care provider. Do not stop taking the antibiotic even if you start to feel better.  Keep all pre-birth (prenatal) visits and follow-up visits as told by your health care provider. This is important. Contact a health care provider if:  You have pain or burning when you urinate.  You have to urinate more often than usual.  You have a fever or chills.  You develop a bad-smelling vaginal discharge. Get help right away if:  Your water breaks.  You go into labor.  You have severe pain in your abdomen.  You have difficulty breathing.  You have chest pain. These symptoms may represent a serious problem that is an emergency. Do not wait to see if the symptoms will go away. Get medical help right away. Call your local emergency services (911 in the [place] Do not drive yourself  to the hospital. Summary  GBS is a type of bacteria that is common in healthy people.  During pregnancy, colonization with GBS can cause serious complications for you or your baby.  Your health care provider will screen you between 35 and 37 weeks of pregnancy to determine if you are colonized with GBS.  If you are colonized with GBS during  pregnancy, your health care provider will recommend antibiotics through an IV during labor.  After delivery, your baby will be evaluated for complications related to potential GBS infection and may require antibiotics to prevent a serious infection. This information is not intended to replace advice given to you by your health care provider. Make sure you discuss any questions you have with your health care provider. Document Revised: 10/18/2018 Document Reviewed: 10/18/2018 Elsevier Patient Education  2020 [employer]   Breastfeeding  Choosing to breastfeed is one of the best decisions you can make for yourself and your baby. A change in hormones during pregnancy causes your breasts to make breast milk in your milk-producing glands. Hormones prevent breast milk from being released before your baby is born. They also prompt milk flow after birth. Once breastfeeding has begun, thoughts of your baby, as well as his or her sucking or crying, can stimulate the release of milk from your milk-producing glands. Benefits of breastfeeding Research shows that breastfeeding offers many health benefits for infants and mothers. It also offers a cost-free and convenient way to feed your baby. For your baby  Your first milk (colostrum) helps your baby's digestive system to function better.  Special cells in your milk (antibodies) help your baby to fight off infections.  Breastfed babies are less likely to develop asthma, allergies, obesity, or type 2 diabetes. They are also at lower risk for sudden infant death syndrome (SIDS).  Nutrients in breast milk are better able to meet your baby's needs compared to infant formula.  Breast milk improves your baby's brain development. For you  Breastfeeding helps to create a very special bond between you and your baby.  Breastfeeding is convenient. Breast milk costs nothing and is always available at the correct temperature.  Breastfeeding helps to burn  calories. It helps you to lose the weight that you gained during pregnancy.  Breastfeeding makes your uterus return faster to its size before pregnancy. It also slows bleeding (lochia) after you give birth.  Breastfeeding helps to lower your risk of developing type 2 diabetes, osteoporosis, rheumatoid arthritis, cardiovascular disease, and breast, ovarian, uterine, and endometrial cancer later in life. Breastfeeding basics Starting breastfeeding  Find a comfortable place to sit or lie down, with your neck and back well-supported.  Place a pillow or a rolled-up blanket under your baby to bring him or her to the level of your breast (if you are seated). Nursing pillows are specially designed to help support your arms and your baby while you breastfeed.  Make sure that your baby's tummy (abdomen) is facing your abdomen.  Gently massage your breast. With your fingertips, massage from the outer edges of your breast inward toward the nipple. This encourages milk flow. If your milk flows slowly, you may need to continue this action during the feeding.  Support your breast with 4 fingers underneath and your thumb above your nipple (make the letter "C" with your hand). Make sure your fingers are well away from your nipple and your baby's mouth.  Stroke your baby's lips gently with your finger or nipple.  When your baby's mouth is  open wide enough, quickly bring your baby to your breast, placing your entire nipple and as much of the areola as possible into your baby's mouth. The areola is the colored area around your nipple. ? More areola should be visible above your baby's upper lip than below the lower lip. ? Your baby's lips should be opened and extended outward (flanged) to ensure an adequate, comfortable latch. ? Your baby's tongue should be between his or her lower gum and your breast.  Make sure that your baby's mouth is correctly positioned around your nipple (latched). Your baby's lips should  create a seal on your breast and be turned out (everted).  It is common for your baby to suck about 2-3 minutes in order to start the flow of breast milk. Latching Teaching your baby how to latch onto your breast properly is very important. An improper latch can cause nipple pain, decreased milk supply, and poor weight gain in your baby. Also, if your baby is not latched onto your nipple properly, he or she may swallow some air during feeding. This can make your baby fussy. Burping your baby when you switch breasts during the feeding can help to get rid of the air. However, teaching your baby to latch on properly is still the best way to prevent fussiness from swallowing air while breastfeeding. Signs that your baby has successfully latched onto your nipple  Silent tugging or silent sucking, without causing you pain. Infant's lips should be extended outward (flanged).  Swallowing heard between every 3-4 sucks once your milk has started to flow (after your let-down milk reflex occurs).  Muscle movement above and in front of his or her ears while sucking. Signs that your baby has not successfully latched onto your nipple  Sucking sounds or smacking sounds from your baby while breastfeeding.  Nipple pain. If you think your baby has not latched on correctly, slip your finger into the corner of your baby's mouth to break the suction and place it between your baby's gums. Attempt to start breastfeeding again. Signs of successful breastfeeding Signs from your baby  Your baby will gradually decrease the number of sucks or will completely stop sucking.  Your baby will fall asleep.  Your baby's body will relax.  Your baby will retain a small amount of milk in his or her mouth.  Your baby will let go of your breast by himself or herself. Signs from you  Breasts that have increased in firmness, weight, and size 1-3 hours after feeding.  Breasts that are softer immediately after  breastfeeding.  Increased milk volume, as well as a change in milk consistency and color by the fifth day of breastfeeding.  Nipples that are not sore, cracked, or bleeding. Signs that your baby is getting enough milk  Wetting at least 1-2 diapers during the first 24 hours after birth.  Wetting at least 5-6 diapers every 24 hours for the first week after birth. The urine should be clear or pale yellow by the age of 5 days.  Wetting 6-8 diapers every 24 hours as your baby continues to grow and develop.  At least 3 stools in a 24-hour period by the age of 5 days. The stool should be soft and yellow.  At least 3 stools in a 24-hour period by the age of 7 days. The stool should be seedy and yellow.  No loss of weight greater than 10% of birth weight during the first 3 days of life.  Average weight  gain of 4-7 oz (113-198 g) per week after the age of 4 days.  Consistent daily weight gain by the age of 5 days, without weight loss after the age of 2 weeks. After a feeding, your baby may spit up a small amount of milk. This is normal. Breastfeeding frequency and duration Frequent feeding will help you make more milk and can prevent sore nipples and extremely full breasts (breast engorgement). Breastfeed when you feel the need to reduce the fullness of your breasts or when your baby shows signs of hunger. This is called "breastfeeding on demand." Signs that your baby is hungry include:  Increased alertness, activity, or restlessness.  Movement of the head from side to side.  Opening of the mouth when the corner of the mouth or cheek is stroked (rooting).  Increased sucking sounds, smacking lips, cooing, sighing, or squeaking.  Hand-to-mouth movements and sucking on fingers or hands.  Fussing or crying. Avoid introducing a pacifier to your baby in the first 4-6 weeks after your baby is born. After this time, you may choose to use a pacifier. Research has shown that pacifier use during the  first year of a baby's life decreases the risk of sudden infant death syndrome (SIDS). Allow your baby to feed on each breast as long as he or she wants. When your baby unlatches or falls asleep while feeding from the first breast, offer the second breast. Because newborns are often sleepy in the first few weeks of life, you may need to awaken your baby to get him or her to feed. Breastfeeding times will vary from baby to baby. However, the following rules can serve as a guide to help you make sure that your baby is properly fed:  Newborns (babies 49 weeks of age or younger) may breastfeed every 1-3 hours.  Newborns should not go without breastfeeding for longer than 3 hours during the day or 5 hours during the night.  You should breastfeed your baby a minimum of 8 times in a 24-hour period. Breast milk pumping     Pumping and storing breast milk allows you to make sure that your baby is exclusively fed your breast milk, even at times when you are unable to breastfeed. This is especially important if you go back to work while you are still breastfeeding, or if you are not able to be present during feedings. Your lactation consultant can help you find a method of pumping that works best for you and give you guidelines about how long it is safe to store breast milk. Caring for your breasts while you breastfeed Nipples can become dry, cracked, and sore while breastfeeding. The following recommendations can help keep your breasts moisturized and healthy:  Avoid using soap on your nipples.  Wear a supportive bra designed especially for nursing. Avoid wearing underwire-style bras or extremely tight bras (sports bras).  Air-dry your nipples for 3-4 minutes after each feeding.  Use only cotton bra pads to absorb leaked breast milk. Leaking of breast milk between feedings is normal.  Use lanolin on your nipples after breastfeeding. Lanolin helps to maintain your skin's normal moisture barrier. Pure  lanolin is not harmful (not toxic) to your baby. You may also hand express a few drops of breast milk and gently massage that milk into your nipples and allow the milk to air-dry. In the first few weeks after giving birth, some women experience breast engorgement. Engorgement can make your breasts feel heavy, warm, and tender to the touch.  Engorgement peaks within 3-5 days after you give birth. The following recommendations can help to ease engorgement:  Completely empty your breasts while breastfeeding or pumping. You may want to start by applying warm, moist heat (in the shower or with warm, water-soaked hand towels) just before feeding or pumping. This increases circulation and helps the milk flow. If your baby does not completely empty your breasts while breastfeeding, pump any extra milk after he or she is finished.  Apply ice packs to your breasts immediately after breastfeeding or pumping, unless this is too uncomfortable for you. To do this: ? Put ice in a plastic bag. ? Place a towel between your skin and the bag. ? Leave the ice on for 20 minutes, 2-3 times a day.  Make sure that your baby is latched on and positioned properly while breastfeeding. If engorgement persists after 48 hours of following these recommendations, contact your health care provider or a Advertising copywriter. Overall health care recommendations while breastfeeding  Eat 3 healthy meals and 3 snacks every day. Well-nourished mothers who are breastfeeding need an additional 450-500 calories a day. You can meet this requirement by increasing the amount of a balanced diet that you eat.  Drink enough water to keep your urine pale yellow or clear.  Rest often, relax, and continue to take your prenatal vitamins to prevent fatigue, stress, and low vitamin and mineral levels in your body (nutrient deficiencies).  Do not use any products that contain nicotine or tobacco, such as cigarettes and e-cigarettes. Your baby may be  harmed by chemicals from cigarettes that pass into breast milk and exposure to secondhand smoke. If you need help quitting, ask your health care provider.  Avoid alcohol.  Do not use illegal drugs or marijuana.  Talk with your health care provider before taking any medicines. These include over-the-counter and prescription medicines as well as vitamins and herbal supplements. Some medicines that may be harmful to your baby can pass through breast milk.  It is possible to become pregnant while breastfeeding. If birth control is desired, ask your health care provider about options that will be safe while breastfeeding your baby. Where to find more information: [employer]: www.llli.org Contact a health care provider if:  You feel like you want to stop breastfeeding or have become frustrated with breastfeeding.  Your nipples are cracked or bleeding.  Your breasts are red, tender, or warm.  You have: ? Painful breasts or nipples. ? A swollen area on either breast. ? A fever or chills. ? Nausea or vomiting. ? Drainage other than breast milk from your nipples.  Your breasts do not become full before feedings by the fifth day after you give birth.  You feel sad and depressed.  Your baby is: ? Too sleepy to eat well. ? Having trouble sleeping. ? More than 20 week old and wetting fewer than 6 diapers in a 24-hour period. ? Not gaining weight by 24 days of age.  Your baby has fewer than 3 stools in a 24-hour period.  Your baby's skin or the white parts of his or her eyes become yellow. Get help right away if:  Your baby is overly tired (lethargic) and does not want to wake up and feed.  Your baby develops an unexplained fever. Summary  Breastfeeding offers many health benefits for infant and mothers.  Try to breastfeed your infant when he or she shows early signs of hunger.  Gently tickle or stroke your baby's lips with  your finger or nipple to allow the baby  to open his or her mouth. Bring the baby to your breast. Make sure that much of the areola is in your baby's mouth. Offer one side and burp the baby before you offer the other side.  Talk with your health care provider or lactation consultant if you have questions or you face problems as you breastfeed. This information is not intended to replace advice given to you by your health care provider. Make sure you discuss any questions you have with your health care provider. Document Revised: 06/18/2017 Document Reviewed: 04/25/2016 Elsevier Patient Education  2020 [employer].

## 2020-04-04 LAB — GC/CHLAMYDIA PROBE AMP (~~LOC~~) NOT AT ARMC
Chlamydia: NEGATIVE
Comment: NEGATIVE
Comment: NORMAL
Neisseria Gonorrhea: NEGATIVE

## 2020-04-07 LAB — CULTURE, BETA STREP (GROUP B ONLY): Strep Gp B Culture: NEGATIVE

## 2020-04-07 NOTE — L&D Delivery Note (Signed)
LABOR COURSE Patient was admitted in active labor. She was determined to be 9.5/100/+2 shortly after arrival on L&D. She SROM'd with initiation of pushing. Meconium stained fluid noted.  Delivery Note Called to room and patient was complete and pushing. Head delivered ROT. No nuchal cord present. Shoulder and body delivered in usual fashion. At 1233 a viable female was delivered via Vaginal, Spontaneous (Presentation: ROT ; ROA ).  Infant with spontaneous cry, placed on mother's abdomen, dried and stimulated. Cord clamped x 2 after two-minute delay, and cut by FOB. Cord pale and pulseless at time of cutting. Cord blood drawn. Placenta delivered spontaneously with gentle cord traction. Appears intact. Fundus firm with massage and Pitocin. Labia, perineum, vagina, and cervix inspected.    APGAR:9,10; weight: 3246g  .   Cord: 3VC with the following complications: none     Anesthesia:  Fentanyl given shortly before delivery Episiotomy: None Lacerations: bilateral periuretheral abrasions. Hemostatic, repair not indicated Suture Repair: N/A Est. Blood Loss (mL): 50  Mom to postpartum.  Baby to Couplet care / Skin to Skin.  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  --Patient unable to accomplish fetal echo this pregnancy due to transportation concerns. Dr. Katrinka Blazing, Neonatologist, notified.  --Social work consult ordered for assistance with transportation concerns --Patient is interested in inpatient tubal but uncertain about current status of Medicaid transfer from [place] to [place]. Also desires ongoing discussion with partner. LCSW contacted by Dr. Barb Merino for financial information. Will assess for inpatient eligibility and patient preferences tomorrow.   Clayton Bibles, CNM 04/23/20 2:25 PM

## 2020-04-09 ENCOUNTER — Encounter: Payer: Self-pay | Admitting: General Practice

## 2020-04-09 ENCOUNTER — Other Ambulatory Visit: Payer: Self-pay

## 2020-04-09 ENCOUNTER — Ambulatory Visit (INDEPENDENT_AMBULATORY_CARE_PROVIDER_SITE_OTHER): Payer: Self-pay | Admitting: *Deleted

## 2020-04-09 ENCOUNTER — Ambulatory Visit (INDEPENDENT_AMBULATORY_CARE_PROVIDER_SITE_OTHER): Payer: Self-pay

## 2020-04-09 ENCOUNTER — Ambulatory Visit (INDEPENDENT_AMBULATORY_CARE_PROVIDER_SITE_OTHER): Payer: Self-pay | Admitting: Nurse Practitioner

## 2020-04-09 VITALS — BP 118/66 | HR 65 | Wt 265.0 lb

## 2020-04-09 DIAGNOSIS — O99210 Obesity complicating pregnancy, unspecified trimester: Secondary | ICD-10-CM

## 2020-04-09 DIAGNOSIS — O09523 Supervision of elderly multigravida, third trimester: Secondary | ICD-10-CM

## 2020-04-09 DIAGNOSIS — O26893 Other specified pregnancy related conditions, third trimester: Secondary | ICD-10-CM

## 2020-04-09 DIAGNOSIS — Z3A37 37 weeks gestation of pregnancy: Secondary | ICD-10-CM

## 2020-04-09 DIAGNOSIS — R12 Heartburn: Secondary | ICD-10-CM

## 2020-04-09 DIAGNOSIS — O0990 Supervision of high risk pregnancy, unspecified, unspecified trimester: Secondary | ICD-10-CM

## 2020-04-09 NOTE — Progress Notes (Signed)

## 2020-04-09 NOTE — Progress Notes (Signed)
    Subjective:  Sophia Meza is a 40 y.o. [id number] at [redacted]w[redacted]d being seen today for ongoing prenatal care.  She is currently monitored for the following issues for this high-risk pregnancy and has Supervision of high risk pregnancy, antepartum; Obesity in pregnancy, antepartum; History of pre-eclampsia in prior pregnancy, currently pregnant; History of birth defect; Anemia during pregnancy in second trimester; AMA (advanced maternal age) multigravida 35+; and Heartburn on their problem list.  Patient reports pelvic pressure with sciatic pain.  Contractions: Irritability. Vag. Bleeding: None.  Movement: Present. Denies leaking of fluid.   The following portions of the patient's history were reviewed and updated as appropriate: allergies, current medications, past family history, past medical history, past social history, past surgical history and problem list. Problem list updated.  Objective:   Vitals:   04/09/20 0956  BP: 118/66  Pulse: 65  Weight: 265 lb (120.2 kg)    Fetal Status: Fetal Heart Rate (bpm): 122 Fundal Height: 39 cm Movement: Present     General:  Alert, oriented and cooperative. Patient is in no acute distress.  Skin: Skin is warm and dry. No rash noted.   Cardiovascular: Normal heart rate noted  Respiratory: Normal respiratory effort, no problems with respiration noted  Abdomen: Soft, gravid, appropriate for gestational age. Pain/Pressure: Present     Pelvic:  Cervical exam deferred        Extremities: Normal range of motion.  Edema: None  Mental Status: Normal mood and affect. Normal behavior. Normal judgment and thought content.   Urinalysis:      Assessment and Plan:  Pregnancy: [id number] at [redacted]w[redacted]d  1. Supervision of high risk pregnancy, antepartum Missed [place] - unable to reschedule - appointments are more than 2 weeks out BPP and AFI in the office today and scheduled again in one week  2. Obesity in pregnancy, antepartum   3. Heartburn during  pregnancy in third trimester Taking tums and it is helping  4. Multigravida of advanced maternal age in third trimester MFM advises delivery at 39-40 weeks - will discuss at next visit   Term labor symptoms and general obstetric precautions including but not limited to vaginal bleeding, contractions, leaking of fluid and fetal movement were reviewed in detail with the patient. Please refer to After Visit Summary for other counseling recommendations.  Return in about 1 week (around 04/16/2020) for already scheduled for ROB.  Nolene Bernheim, RN, MSN, NP-BC Nurse Practitioner, [facility] for [employer], [facility] 04/09/2020 10:11 AM

## 2020-04-10 ENCOUNTER — Encounter: Payer: Self-pay | Admitting: Family Medicine

## 2020-04-17 ENCOUNTER — Encounter: Payer: Self-pay | Admitting: Family Medicine

## 2020-04-19 ENCOUNTER — Telehealth (HOSPITAL_COMMUNITY): Payer: Self-pay | Admitting: *Deleted

## 2020-04-19 ENCOUNTER — Other Ambulatory Visit: Payer: Self-pay

## 2020-04-19 ENCOUNTER — Ambulatory Visit (INDEPENDENT_AMBULATORY_CARE_PROVIDER_SITE_OTHER): Payer: Self-pay | Admitting: *Deleted

## 2020-04-19 ENCOUNTER — Ambulatory Visit: Payer: Self-pay

## 2020-04-19 ENCOUNTER — Encounter: Payer: Self-pay | Admitting: Obstetrics and Gynecology

## 2020-04-19 ENCOUNTER — Ambulatory Visit (INDEPENDENT_AMBULATORY_CARE_PROVIDER_SITE_OTHER): Payer: Self-pay | Admitting: Obstetrics and Gynecology

## 2020-04-19 ENCOUNTER — Encounter (HOSPITAL_COMMUNITY): Payer: Self-pay | Admitting: *Deleted

## 2020-04-19 VITALS — BP 122/77 | HR 69 | Wt 264.2 lb

## 2020-04-19 DIAGNOSIS — O09523 Supervision of elderly multigravida, third trimester: Secondary | ICD-10-CM

## 2020-04-19 DIAGNOSIS — O09299 Supervision of pregnancy with other poor reproductive or obstetric history, unspecified trimester: Secondary | ICD-10-CM

## 2020-04-19 DIAGNOSIS — O0990 Supervision of high risk pregnancy, unspecified, unspecified trimester: Secondary | ICD-10-CM

## 2020-04-19 DIAGNOSIS — Z87798 Personal history of other (corrected) congenital malformations: Secondary | ICD-10-CM

## 2020-04-19 NOTE — Telephone Encounter (Signed)
Preadmission screen  

## 2020-04-19 NOTE — Progress Notes (Signed)
Subjective:  Sophia Meza is a 40 y.o. [id number] at [redacted]w[redacted]d being seen today for ongoing prenatal care.  She is currently monitored for the following issues for this high-risk pregnancy and has Supervision of high risk pregnancy, antepartum; Obesity in pregnancy, antepartum; History of pre-eclampsia in prior pregnancy, currently pregnant; History of birth defect; Anemia during pregnancy in second trimester; AMA (advanced maternal age) multigravida 35+; and Heartburn on their problem list.  Patient reports general discomforts of pregnancy.  Contractions: Not present. Vag. Bleeding: None.  Movement: Present. Denies leaking of fluid.   The following portions of the patient's history were reviewed and updated as appropriate: allergies, current medications, past family history, past medical history, past social history, past surgical history and problem list. Problem list updated.  Objective:   Vitals:   04/19/20 0835  BP: 122/77  Pulse: 69  Weight: 264 lb 3.2 oz (119.8 kg)    Fetal Status: Fetal Heart Rate (bpm): NST   Movement: Present     General:  Alert, oriented and cooperative. Patient is in no acute distress.  Skin: Skin is warm and dry. No rash noted.   Cardiovascular: Normal heart rate noted  Respiratory: Normal respiratory effort, no problems with respiration noted  Abdomen: Soft, gravid, appropriate for gestational age. Pain/Pressure: Present     Pelvic:  Cervical exam deferred        Extremities: Normal range of motion.  Edema: None  Mental Status: Normal mood and affect. Normal behavior. Normal judgment and thought content.   Urinalysis:      Assessment and Plan:  Pregnancy: [id number] at [redacted]w[redacted]d  1. Supervision of high risk pregnancy, antepartum Stable Labor precautions  2. Multigravida of advanced maternal age in third trimester Stable BPP 8/10 today IOL scheduled  3. History of birth defect Normal fetal ECHO  4. History of pre-eclampsia in prior pregnancy, currently  pregnant BP stable No S/Sx of PEC  Term labor symptoms and general obstetric precautions including but not limited to vaginal bleeding, contractions, leaking of fluid and fetal movement were reviewed in detail with the patient. Please refer to After Visit Summary for other counseling recommendations.  Return in about 1 week (around 04/26/2020) for as scheduled; cancel appts on 1/27.   Hermina Staggers, MD

## 2020-04-19 NOTE — Patient Instructions (Signed)

## 2020-04-19 NOTE — Progress Notes (Signed)

## 2020-04-20 ENCOUNTER — Other Ambulatory Visit: Payer: Self-pay | Admitting: Advanced Practice Midwife

## 2020-04-23 ENCOUNTER — Other Ambulatory Visit: Payer: Self-pay

## 2020-04-23 ENCOUNTER — Inpatient Hospital Stay (HOSPITAL_COMMUNITY)
Admission: AD | Admit: 2020-04-23 | Discharge: 2020-04-25 | DRG: 807 | Disposition: A | Discharge status: Home / Self Care | Payer: Self-pay | Attending: Obstetrics and Gynecology | Admitting: Obstetrics and Gynecology

## 2020-04-23 ENCOUNTER — Encounter (HOSPITAL_COMMUNITY): Payer: Self-pay | Admitting: Obstetrics and Gynecology

## 2020-04-23 DIAGNOSIS — Z3A39 39 weeks gestation of pregnancy: Secondary | ICD-10-CM

## 2020-04-23 DIAGNOSIS — Z20822 Contact with and (suspected) exposure to covid-19: Secondary | ICD-10-CM | POA: Diagnosis present

## 2020-04-23 DIAGNOSIS — Z87891 Personal history of nicotine dependence: Secondary | ICD-10-CM

## 2020-04-23 LAB — RESP PANEL BY RT-PCR (FLU A&B, COVID) ARPGX2
Influenza A by PCR: NEGATIVE
Influenza B by PCR: NEGATIVE
SARS Coronavirus 2 by RT PCR: NEGATIVE

## 2020-04-23 LAB — CBC
HCT: 36.8 % (ref 36.0–46.0)
Hemoglobin: 12 g/dL (ref 12.0–15.0)
MCH: 26.7 pg (ref 26.0–34.0)
MCHC: 32.6 g/dL (ref 30.0–36.0)
MCV: 82 fL (ref 80.0–100.0)
Platelets: 232 10*3/uL (ref 150–400)
RBC: 4.49 MIL/uL (ref 3.87–5.11)
RDW: 14.6 % (ref 11.5–15.5)
WBC: 8.5 10*3/uL (ref 4.0–10.5)
nRBC: 0 % (ref 0.0–0.2)

## 2020-04-23 LAB — TYPE AND SCREEN
ABO/RH(D): B POS
Antibody Screen: NEGATIVE

## 2020-04-23 MED ORDER — LIDOCAINE HCL (PF) 1 % IJ SOLN: 30.0000 mL | INTRAMUSCULAR | Status: DC | PRN

## 2020-04-23 MED ORDER — ONDANSETRON HCL 4 MG/2ML IJ SOLN: 4.0000 mg | INTRAMUSCULAR | Status: DC | PRN

## 2020-04-23 MED ORDER — SIMETHICONE 80 MG PO CHEW: 80.0000 mg | CHEWABLE_TABLET | ORAL | Status: DC | PRN

## 2020-04-23 MED ORDER — OXYTOCIN-SODIUM CHLORIDE 30-0.9 UT/500ML-% IV SOLN
2.5000 [IU]/h | INTRAVENOUS | Status: DC
  Filled 2020-04-23: qty 500

## 2020-04-23 MED ORDER — LACTATED RINGERS IV SOLN: INTRAVENOUS | Status: DC

## 2020-04-23 MED ORDER — EPHEDRINE 5 MG/ML INJ: 10.0000 mg | INTRAVENOUS | Status: DC | PRN

## 2020-04-23 MED ORDER — ACETAMINOPHEN 325 MG PO TABS: 650.0000 mg | ORAL_TABLET | ORAL | Status: DC | PRN

## 2020-04-23 MED ORDER — COCONUT OIL OIL: 1.0000 "application " | TOPICAL_OIL | Status: DC | PRN

## 2020-04-23 MED ORDER — LACTATED RINGERS IV SOLN: 500.0000 mL | Freq: Once | INTRAVENOUS | Status: DC

## 2020-04-23 MED ORDER — LACTATED RINGERS IV SOLN: 500.0000 mL | INTRAVENOUS | Status: DC | PRN

## 2020-04-23 MED ORDER — ACETAMINOPHEN 325 MG PO TABS
650.0000 mg | ORAL_TABLET | ORAL | Status: DC | PRN
  Administered 2020-04-23 – 2020-04-24 (×5): 650 mg via ORAL
  Filled 2020-04-23 (×5): qty 2

## 2020-04-23 MED ORDER — OXYCODONE-ACETAMINOPHEN 5-325 MG PO TABS
2.0000 | ORAL_TABLET | ORAL | Status: DC | PRN
Start: 2020-04-23 — End: 2020-04-23

## 2020-04-23 MED ORDER — ONDANSETRON HCL 4 MG/2ML IJ SOLN: 4.0000 mg | Freq: Four times a day (QID) | INTRAMUSCULAR | Status: DC | PRN

## 2020-04-23 MED ORDER — MAGNESIUM HYDROXIDE 400 MG/5ML PO SUSP: 30.0000 mL | ORAL | Status: DC | PRN

## 2020-04-23 MED ORDER — SOD CITRATE-CITRIC ACID 500-334 MG/5ML PO SOLN: 30.0000 mL | ORAL | Status: DC | PRN

## 2020-04-23 MED ORDER — WITCH HAZEL-GLYCERIN EX PADS: 1.0000 "application " | MEDICATED_PAD | CUTANEOUS | Status: DC | PRN

## 2020-04-23 MED ORDER — FERROUS SULFATE 325 (65 FE) MG PO TABS: 325.0000 mg | ORAL_TABLET | Freq: Two times a day (BID) | ORAL | Status: DC

## 2020-04-23 MED ORDER — PHENYLEPHRINE 40 MCG/ML (10ML) SYRINGE FOR IV PUSH (FOR BLOOD PRESSURE SUPPORT): 80.0000 ug | PREFILLED_SYRINGE | INTRAVENOUS | Status: DC | PRN

## 2020-04-23 MED ORDER — TETANUS-DIPHTH-ACELL PERTUSSIS 5-2.5-18.5 LF-MCG/0.5 IM SUSY: 0.5000 mL | PREFILLED_SYRINGE | Freq: Once | INTRAMUSCULAR | Status: DC

## 2020-04-23 MED ORDER — IBUPROFEN 600 MG PO TABS: 600.0000 mg | ORAL_TABLET | Freq: Four times a day (QID) | ORAL | Status: DC | PRN

## 2020-04-23 MED ORDER — FENTANYL-BUPIVACAINE-NACL 0.5-0.125-0.9 MG/250ML-% EP SOLN
12.0000 mL/h | EPIDURAL | Status: DC | PRN
Start: 2020-04-23 — End: 2020-04-23

## 2020-04-23 MED ORDER — FERROUS SULFATE 325 (65 FE) MG PO TABS
325.0000 mg | ORAL_TABLET | ORAL | Status: DC
  Administered 2020-04-25: 325 mg via ORAL
  Filled 2020-04-23: qty 1

## 2020-04-23 MED ORDER — DIBUCAINE (PERIANAL) 1 % EX OINT: 1.0000 "application " | TOPICAL_OINTMENT | CUTANEOUS | Status: DC | PRN

## 2020-04-23 MED ORDER — DIPHENHYDRAMINE HCL 25 MG PO CAPS: 25.0000 mg | ORAL_CAPSULE | Freq: Four times a day (QID) | ORAL | Status: DC | PRN

## 2020-04-23 MED ORDER — MEASLES, MUMPS & RUBELLA VAC IJ SOLR
0.5000 mL | Freq: Once | INTRAMUSCULAR | Status: DC
Start: 2020-04-24 — End: 2020-04-25

## 2020-04-23 MED ORDER — BENZOCAINE-MENTHOL 20-0.5 % EX AERO
1.0000 "application " | INHALATION_SPRAY | CUTANEOUS | Status: DC | PRN
  Administered 2020-04-23: 1 via TOPICAL
  Filled 2020-04-23: qty 56

## 2020-04-23 MED ORDER — PRENATAL MULTIVITAMIN CH
1.0000 | ORAL_TABLET | Freq: Every day | ORAL | Status: DC
  Administered 2020-04-24 – 2020-04-25 (×2): 1 via ORAL
  Filled 2020-04-23 (×2): qty 1

## 2020-04-23 MED ORDER — IBUPROFEN 600 MG PO TABS
600.0000 mg | ORAL_TABLET | Freq: Four times a day (QID) | ORAL | Status: DC
  Administered 2020-04-23 – 2020-04-25 (×8): 600 mg via ORAL
  Filled 2020-04-23 (×8): qty 1

## 2020-04-23 MED ORDER — OXYCODONE-ACETAMINOPHEN 5-325 MG PO TABS: 1.0000 | ORAL_TABLET | ORAL | Status: DC | PRN

## 2020-04-23 MED ORDER — DIPHENHYDRAMINE HCL 50 MG/ML IJ SOLN: 12.5000 mg | INTRAMUSCULAR | Status: DC | PRN

## 2020-04-23 MED ORDER — FENTANYL CITRATE (PF) 100 MCG/2ML IJ SOLN
50.0000 ug | INTRAMUSCULAR | Status: DC | PRN
  Administered 2020-04-23: 100 ug via INTRAVENOUS
  Filled 2020-04-23: qty 2

## 2020-04-23 MED ORDER — OXYTOCIN BOLUS FROM INFUSION
333.0000 mL | Freq: Once | INTRAVENOUS | Status: AC
  Administered 2020-04-23: 333 mL via INTRAVENOUS

## 2020-04-23 MED ORDER — DOCUSATE SODIUM 100 MG PO CAPS: 100.0000 mg | ORAL_CAPSULE | Freq: Two times a day (BID) | ORAL | Status: DC | PRN

## 2020-04-23 MED ORDER — ONDANSETRON HCL 4 MG PO TABS: 4.0000 mg | ORAL_TABLET | ORAL | Status: DC | PRN

## 2020-04-23 NOTE — H&P (Signed)
Sophia Meza is a 40 y.o. female presenting for SOL. She endorses recurrent painful contractions since last night 04/23/2019. Patient denies vaginal bleeding, leaking of fluid, decreased fetal movement, fever, falls, or recent illness.   OB History    Gravida  5   Para  3   Term  3   Preterm  0   AB  1   Living  2     SAB  0   IAB  0   Ectopic  0   Multiple  0   Live Births  3          Nursing Staff Provider  Office Location  [facility] Dating  [place] at [redacted]w[redacted]d  Language   English Anatomy US    Flu Vaccine  declined Genetic Screen  NIPS: Nml [id number]  AFP:     TDaP Vaccine   03/19/2020 Hgb A1C or  GTT Normal 1 hour  COVID Vaccine  No   LAB RESULTS   Rhogam  NA Blood Type B/Positive/-- (12/13 1106)   Feeding Plan BR/BO Antibody Negative (12/13 1106)  Contraception BTL  Rubella 3.42 (12/13 1106)  Circumcision NA (girl) RPR Non Reactive (12/13 1106)   Pediatrician   undecided- list given HBsAg Negative (12/13 1106)   Support Person FOB HCVAb   Prenatal Classes  offered HIV Non Reactive (12/13 1106)     BTL Consent Signed 03/19/20 GBS   (For PCN allergy, check sensitivities)   VBAC Consent  n/a Pap     Hgb Electro    BP Cuff In-person CF     SMA     Waterbirth  [ ]  Class [ ]  Consent [ ]  CNM visit    Induction  [ ]  Orders Entered [ ] Foley Y/N   Past Medical History:  Diagnosis Date  . Anemia   . Preeclampsia   . Pregnancy induced hypertension    Past Surgical History:  Procedure Laterality Date  . KNEE SURGERY Right 2019  . UMBILICAL HERNIA REPAIR  2014   Family History: family history includes Diabetes in her father and mother. Social History:  reports that she quit smoking about 6 years ago. Her smoking use included cigarettes. She smoked 0.00 packs per day. She has never used smokeless tobacco. She reports previous alcohol use. She reports that she does not use drugs.     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  All other systems reviewed and are negative.  Dilation: 3.5 Effacement (%): 90 Station: -2 Exam by:: n druebbisch rn Blood pressure 128/81, pulse 75, temperature 97.7 F (36.5 C), resp. rate 18, last menstrual period 08/20/2019. Physical Exam Vitals and nursing note reviewed. Exam conducted with a chaperone present.  Constitutional:      Appearance: Normal appearance.  Cardiovascular:     Rate and Rhythm: Normal rate.     Pulses: Normal pulses.  Pulmonary:     Effort: Pulmonary effort is normal.  Abdominal:     Comments: Gravid  Skin:    Capillary Refill: Capillary refill takes less than  2 seconds.  Neurological:     Mental Status: She is alert and oriented to person, place, and time.     Prenatal labs: ABO, Rh: B/Positive/-- (12/13 1106) Antibody: Negative (12/13 1106) Rubella: 3.42 (12/13 1106) RPR: Non Reactive (12/13 1106)  HBsAg: Negative (12/13 1106)  HIV: Non Reactive (12/13 1106)  GBS: Negative/-- (12/28 1555)   Assessment/Plan: --40 y.o. 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

## 2020-04-23 NOTE — Discharge Summary (Addendum)
Postpartum Discharge Summary    Patient Name: Sophia Meza DOB: [birth year] MRN: [id number]  Date of admission: 04/23/2020 Delivery date:04/23/2020  Delivering provider: Darlina Rumpf  Date of discharge: 04/25/2020  Admitting diagnosis: AMA (advanced maternal age) multigravida 14+ [O09.529] Intrauterine pregnancy: [redacted]w[redacted]d    Secondary diagnosis:  Active Problems:   Normal labor   Vaginal delivery  Additional problems: n/a    Discharge diagnosis: Term Pregnancy Delivered                                              Post partum procedures: none Augmentation: N/A Complications: None  Hospital course: Onset of Labor With Vaginal Delivery      40y.o. yo [id number] 366w3das admitted in Active Labor on 04/23/2020. Patient had an uncomplicated labor course as follows:  Membrane Rupture Time/Date: 12:33 PM ,04/23/2020   Delivery Method:Vaginal, Spontaneous  Episiotomy: None  Lacerations:   none Patient had an uncomplicated postpartum course.  She is ambulating, tolerating a regular diet, passing flatus, and urinating well. Patient is discharged home in stable condition on 04/25/20.  Newborn Data: Birth date:04/23/2020  Birth time:12:33 PM  Gender:Female  Living status:Living   Apgars:9 ,10  Weight:3246 g   Magnesium Sulfate received: No BMZ received: No Rhophylac:N/A MMR:N/A T-DaP:Given prenatally Flu: No Transfusion:No  Physical exam  Vitals:   04/24/20 0331 04/24/20 1609 04/24/20 2148 04/25/20 0505  BP: (!) 141/83 132/78 116/64 125/83  Pulse:  62 61 64  Resp:  '20 18 18  ' Temp:  97.7 F (36.5 C) 98.4 F (36.9 C) 98 F (36.7 C)  TempSrc:  Oral Oral Oral  SpO2:  100% 100% 100%   General: alert, cooperative and no distress Lochia: appropriate Uterine Fundus: firm Incision: N/A DVT Evaluation: No evidence of DVT seen on physical exam. Labs: Lab Results  Component Value Date   WBC 8.5 04/23/2020   HGB 12.0 04/23/2020   HCT 36.8 04/23/2020   MCV 82.0  04/23/2020   PLT 232 04/23/2020   CMP Latest Ref Rng & Units 03/19/2020  Glucose 65 - 99 mg/dL 130(H)  BUN 6 - 20 mg/dL 8  Creatinine 0.57 - 1.00 mg/dL 0.50(L)  Sodium 134 - 144 mmol/L 136  Potassium 3.5 - 5.2 mmol/L 3.9  Chloride 96 - 106 mmol/L 104  CO2 20 - 29 mmol/L 19(L)  Calcium 8.7 - 10.2 mg/dL 8.6(L)  Total Protein 6.0 - 8.5 g/dL 5.9(L)  Total Bilirubin 0.0 - 1.2 mg/dL <0.2  Alkaline Phos 44 - 121 IU/L 78  AST 0 - 40 IU/L 11  ALT 0 - 32 IU/L 12   Edinburgh Score: Edinburgh Postnatal Depression Scale Screening Tool 04/23/2020  I have been able to laugh and see the funny side of things. (No Data)     After visit meds:  Allergies as of 04/25/2020   No Known Allergies     Medication List    TAKE these medications   acetaminophen 325 MG tablet Commonly known as: Tylenol Take 2 tablets (650 mg total) by mouth every 4 (four) hours as needed (for pain scale < 4).   amLODipine 5 MG tablet Commonly known as: NORVASC Take 1 tablet (5 mg total) by mouth daily.   famotidine 20 MG tablet Commonly known as: PEPCID Take 1 tablet (20 mg total) by mouth 2 (two) times daily.  ibuprofen 600 MG tablet Commonly known as: ADVIL Take 1 tablet (600 mg total) by mouth every 6 (six) hours.   PRENATAL VITAMINS PO Take 1 tablet by mouth daily.       Discharge home in stable condition Infant Feeding: Bottle and Breast Infant Disposition:home with mother Discharge instruction: per After Visit Summary and Postpartum booklet. Activity: Advance as tolerated. Pelvic rest for 6 weeks.  Diet: routine diet Future Appointments:  Postpartum message sent by S. Jeronimo Greaves, CNM on 04/23/2020  Please schedule this patient for a Virtual postpartum visit in 4 weeks with the following provider: Any provider. Additional Postpartum F/U: Schedule interval tubal  High risk pregnancy complicated by:  AMA Delivery mode:  Vaginal, Spontaneous  Anticipated Birth Control:  Plans Interval  BTL   7/90/3833 Arrie Senate, MD  GME ATTESTATION:  I saw and evaluated the patient. I agree with the findings and the plan of care as documented in the resident's note.  Arrie Senate, MD OB Fellow, [place] [facility] [place] 04/25/2020 7:34 AM

## 2020-04-23 NOTE — MAU Note (Incomplete)
Pt reprots ctx since last night. Reports some pink discharge and good fetal movment

## 2020-04-23 NOTE — Discharge Instructions (Signed)

## 2020-04-24 ENCOUNTER — Encounter: Payer: Self-pay | Admitting: Family Medicine

## 2020-04-24 LAB — RPR: RPR Ser Ql: NONREACTIVE

## 2020-04-24 MED ORDER — AMLODIPINE BESYLATE 5 MG PO TABS
5.0000 mg | ORAL_TABLET | Freq: Every day | ORAL | Status: DC
  Administered 2020-04-25: 5 mg via ORAL
  Filled 2020-04-24: qty 1

## 2020-04-24 NOTE — Progress Notes (Signed)
Post Partum Day 1 Subjective: no complaints, up ad lib, voiding, tolerating PO and + flatus  Objective: Blood pressure (!) 141/83, pulse 65, temperature 98.2 F (36.8 C), temperature source Oral, resp. rate 17, last menstrual period 08/20/2019, SpO2 99 %, unknown if currently breastfeeding.  Physical Exam:  General: alert, cooperative, appears stated age and no distress Lochia: appropriate Uterine Fundus: firm Incision: N/A DVT Evaluation: No evidence of DVT seen on physical exam.  Recent Labs    04/23/20 1229  HGB 12.0  HCT 36.8    Assessment/Plan: Plan for discharge tomorrow, Breastfeeding and Social Work consult  Baby echo scheduled for later today (per pt) Contraception = interval BTL   LOS: 1 day   Bernerd Limbo 04/24/2020, 7:51 AM

## 2020-04-24 NOTE — Social Work (Signed)
CSW consulted for transportation and limited prenatal care. Referral screened out for limited prenatal care considering MOB had first prenatal visit at [redacted]w[redacted]d, and at least 3 visits.   CSW met with MOB to assess for transportation barriers and offer support. CSW introduced self and role. MOB was observed holding baby "Tommie". MOB appeared happy and was observed interacting appropriately with baby. MOB reported she is doing well postpartum. MOB denies any mental health history and identified FOB and her best friends as supports. MOB denies any SI, HI or being involved in DV. MOB is still choosing a pediatrician and denies transportation barriers. MOB stated she is enrolled with [employer] and can utilize the service when needed. MOB reported she is still waiting on baby's car seat to be delivered, but she plans to use her friends car seat to transport from the hospital.   CSW provided education regarding the baby blues period vs. perinatal mood disorders and discussed treatment for mental health follow up if concerns arise.  CSW recommends self-evaluation during the postpartum time period using the New Mom Checklist from Postpartum Progress and encouraged MOB to contact a medical professional if symptoms are noted at any time.   CSW provided review of Sudden Infant Death Syndrome (SIDS) precautions.  MOB reported baby will sleep in a bassinet.   CSW identifies no further need for intervention and no barriers to discharge at this time.  Abigial Newville, LCSWA Clinical Social Work [facility] [phone] 

## 2020-04-25 ENCOUNTER — Other Ambulatory Visit (HOSPITAL_COMMUNITY): Payer: Self-pay | Admitting: Family Medicine

## 2020-04-25 ENCOUNTER — Other Ambulatory Visit (HOSPITAL_COMMUNITY): Payer: MEDICAID

## 2020-04-25 MED ORDER — IBUPROFEN 600 MG PO TABS: 600.0000 mg | ORAL_TABLET | Freq: Four times a day (QID) | ORAL | 0 refills | Status: DC

## 2020-04-25 MED ORDER — ACETAMINOPHEN 325 MG PO TABS: 650.0000 mg | ORAL_TABLET | ORAL | 0 refills | Status: DC | PRN

## 2020-04-25 MED ORDER — AMLODIPINE BESYLATE 5 MG PO TABS: 5.0000 mg | ORAL_TABLET | Freq: Every day | ORAL | 0 refills | Status: DC

## 2020-04-25 MED FILL — ACETAMINOPHEN 325 MG TABS: 325 | 5 days supply | Qty: 30 | Fill #0

## 2020-04-25 MED FILL — IBUPROFEN 600 MG TABLET: 600 | 7 days supply | Qty: 30 | Fill #0

## 2020-04-25 MED FILL — AMLODIPINE BESYLATE 5 MG TA: 5 | 30 days supply | Qty: 30 | Fill #0

## 2020-04-26 ENCOUNTER — Encounter: Payer: Self-pay | Admitting: Obstetrics & Gynecology

## 2020-04-26 ENCOUNTER — Other Ambulatory Visit: Payer: Self-pay

## 2020-04-27 ENCOUNTER — Inpatient Hospital Stay (HOSPITAL_COMMUNITY): Admission: AD | Admit: 2020-04-27 | Payer: MEDICAID | Source: Home / Self Care | Admitting: Family Medicine

## 2020-04-27 ENCOUNTER — Inpatient Hospital Stay (HOSPITAL_COMMUNITY): Payer: MEDICAID

## 2020-05-01 ENCOUNTER — Encounter: Payer: Self-pay | Admitting: Family Medicine

## 2020-05-03 ENCOUNTER — Other Ambulatory Visit: Payer: Self-pay

## 2020-05-03 ENCOUNTER — Encounter: Payer: Self-pay | Admitting: Obstetrics & Gynecology

## 2020-05-25 ENCOUNTER — Encounter (INDEPENDENT_AMBULATORY_CARE_PROVIDER_SITE_OTHER): Payer: Self-pay | Admitting: Medical

## 2020-05-25 ENCOUNTER — Encounter: Payer: Self-pay | Admitting: Medical

## 2020-05-25 NOTE — Patient Instructions (Signed)
Please reschedule

## 2020-05-25 NOTE — Progress Notes (Signed)
8:20 Ceairra not connected virtually. I called her and informed her I was calling re: her virtual visit scheduled for 8:15 and asked if she is ready to complete her visit. She states she needs to get her kids ready and asked to do visit later. I informed her I will ask registrar to reschedule. Alvenia Treese,RN

## 2020-06-06 ENCOUNTER — Ambulatory Visit: Payer: Self-pay | Admitting: Obstetrics and Gynecology

## 2021-02-02 IMAGING — US US FETAL BPP W/ NON-STRESS
1 series · 13 of 14 positions shown · non-contrast
Comparison: none

[Series 1: us fetal bpp w/ non-stress · 14 acquisitions, 13 frames shown]
[im 1/14]
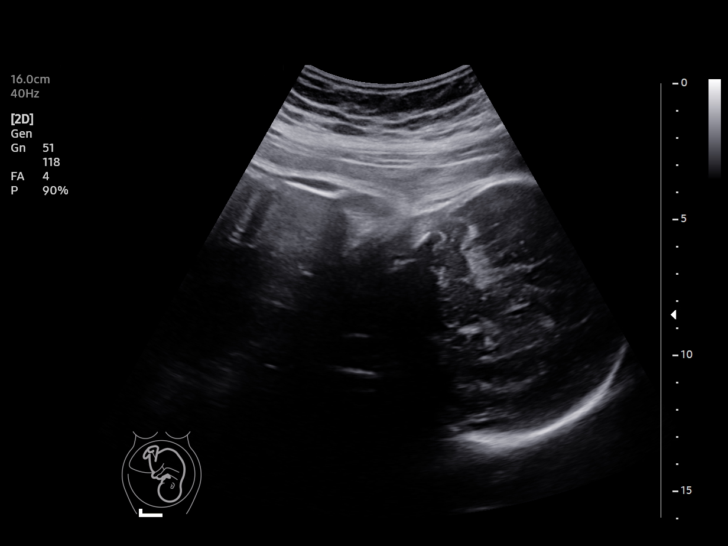
[im 2/14]
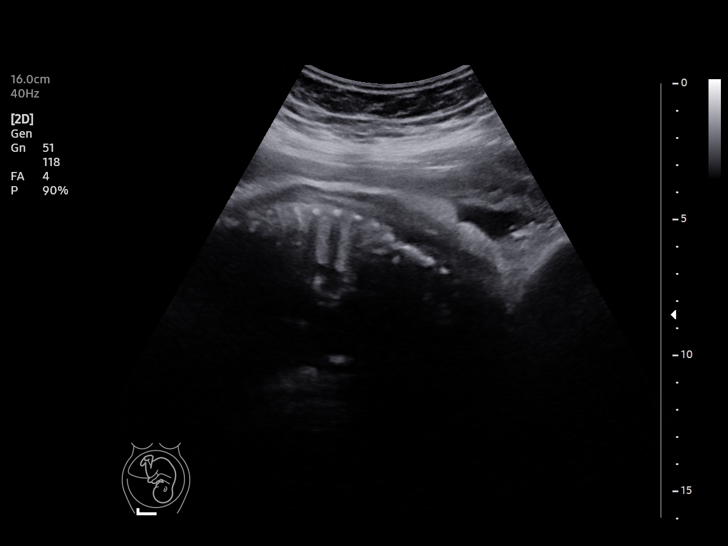
[im 3/14]
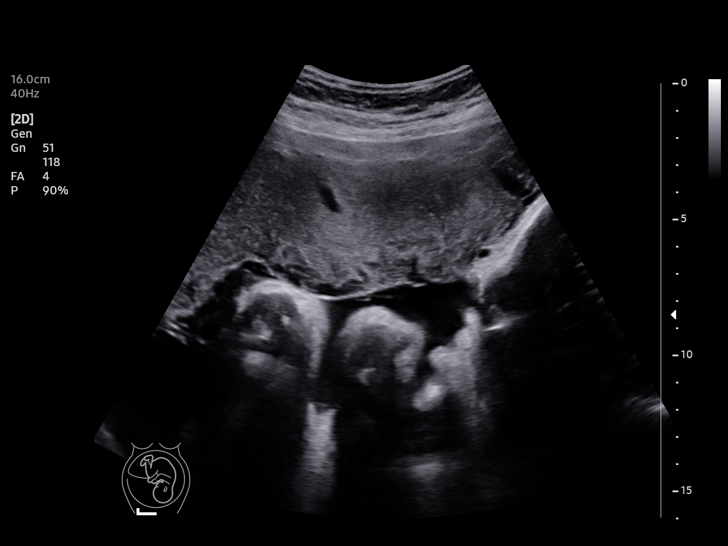
[im 4/14]
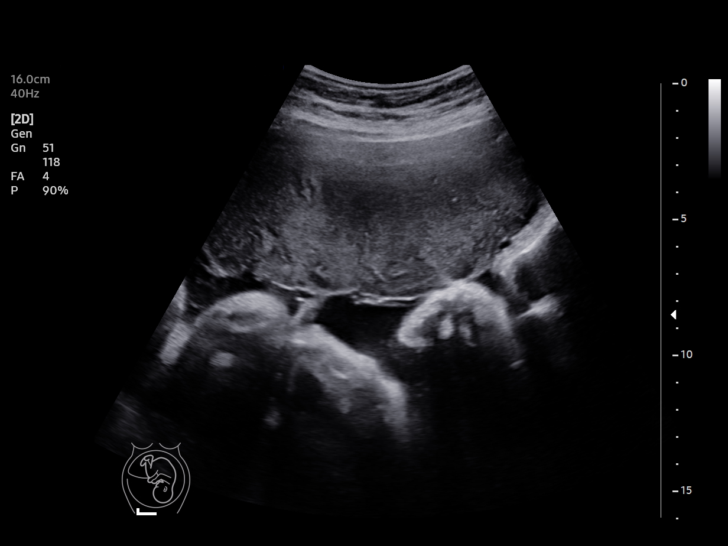
[im 5/14]
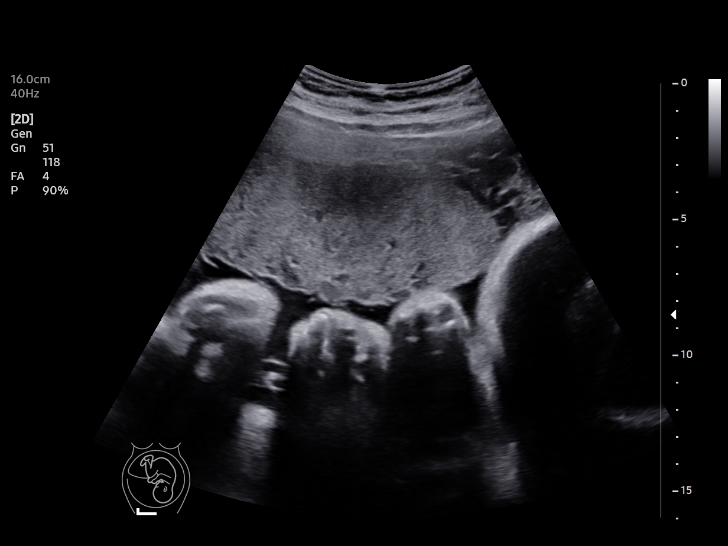
[im 6/14]
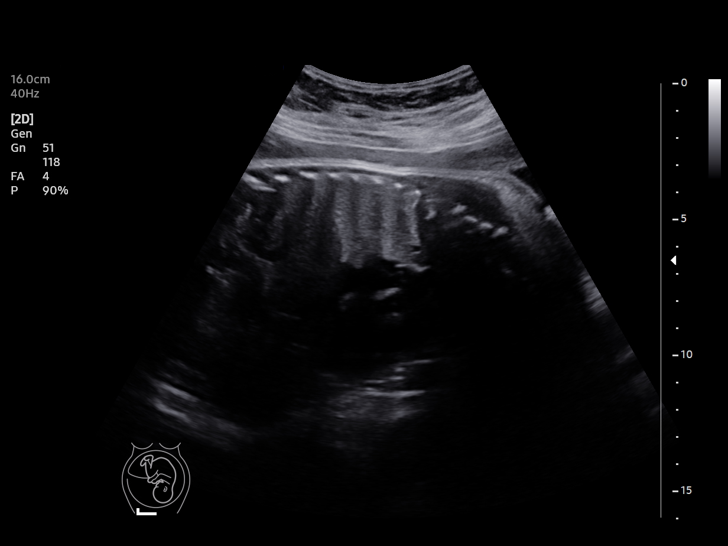
[im 8/14]
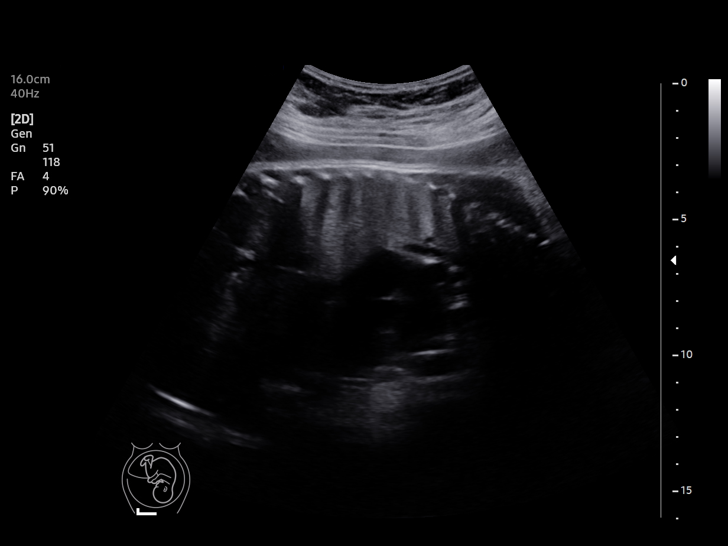
[im 9/14]
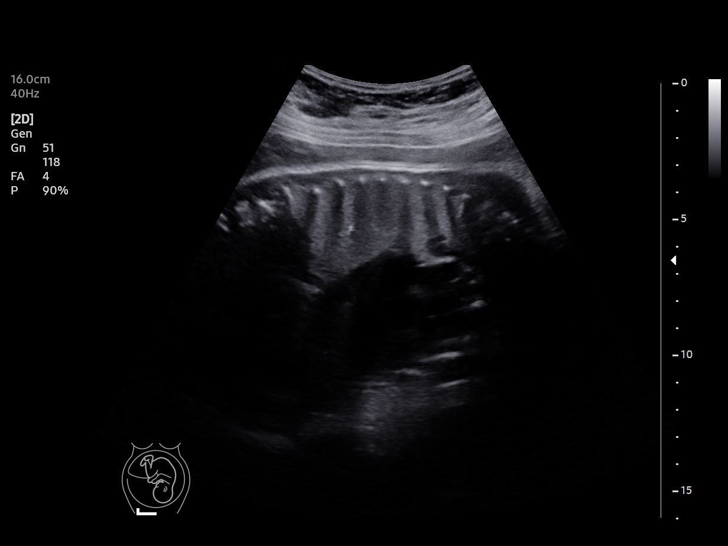
[im 10/14]
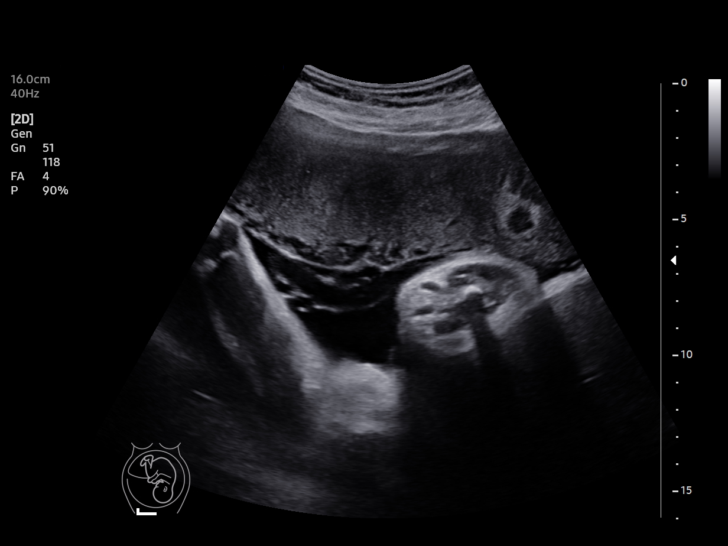
[im 11/14]
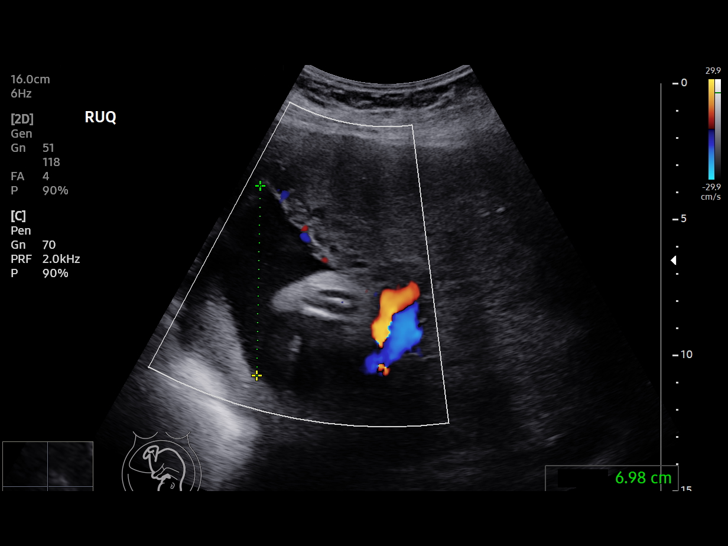
[im 12/14]
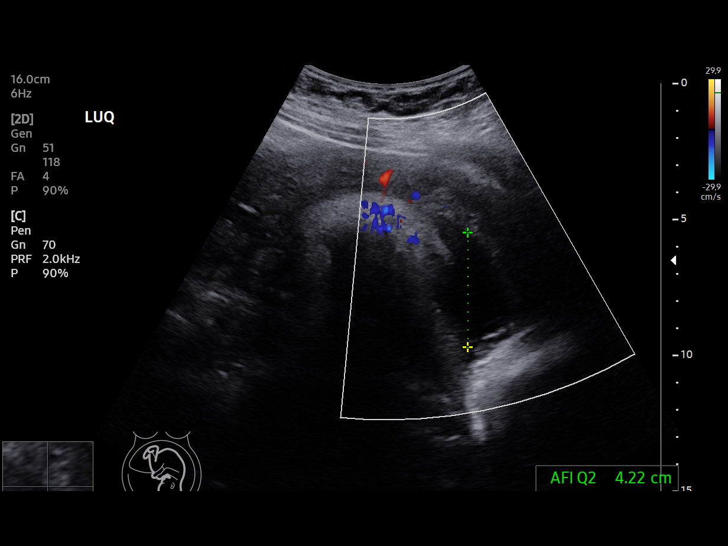
[im 13/14]
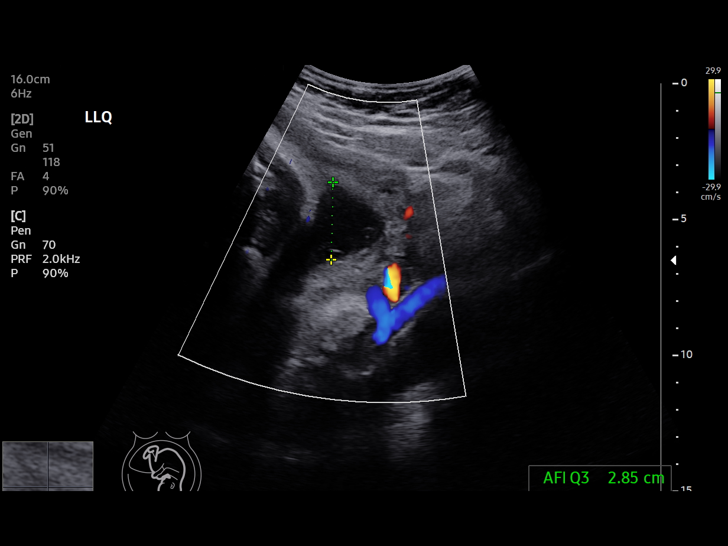
[im 14/14]
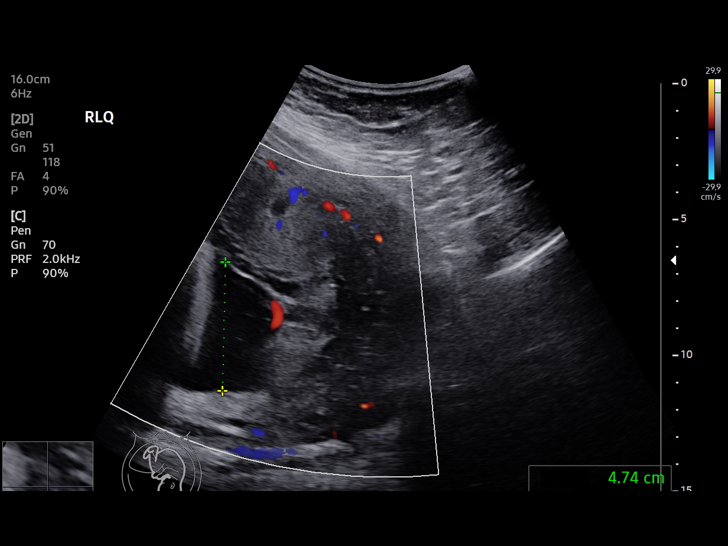

[13 of 14 positions shown; findings below may reference images not displayed]

Healthcare at
                   [id number]

 1  US FETAL BPP W/NONSTRESS              76818.4     ANNA KRZYSZTOF NEOPHYTOU

Service(s) Provided

Indications

 37 weeks gestation of pregnancy
 Advanced maternal age multigravida 35+,
 third trimester
Fetal Evaluation

 Num Of Fetuses:         1
 Preg. Location:         Intrauterine
 Cardiac Activity:       Observed
 Presentation:           Cephalic

 Amniotic Fluid
 AFI FV:      Within normal limits

 AFI Sum(cm)     %Tile       Largest Pocket(cm)
 18.8            73          7

 RUQ(cm)       RLQ(cm)       LUQ(cm)        LLQ(cm)
 7
Biophysical Evaluation

 Amniotic F.V:   Pocket => 2 cm             F. Tone:        Observed
 F. Movement:    Observed                   N.S.T:          Reactive
 F. Breathing:   Observed                   Score:          [DATE]
OB History

 Gravidity:    5         Term:   3        Prem:   0        SAB:   0
 TOP:          1       Ectopic:  0        Living: 3
Gestational Age

 LMP:           33w 2d        Date:  08/20/19                 EDD:   05/26/20
 Best:          37w 3d     Det. By:  U/S  (01/11/20)          EDD:   04/27/20
Impression

 BPP [DATE]
Recommendations

 -Continue weekly BPP till delivery.
                  Wiesse, Oderfliw

## 2021-02-12 IMAGING — US US FETAL BPP W/ NON-STRESS
1 series · 10 of 10 positions shown · non-contrast
Comparison: none

[Series 1: us fetal bpp w/ non-stress · 10 acquisitions, 10 frames shown]
[im 1/10]
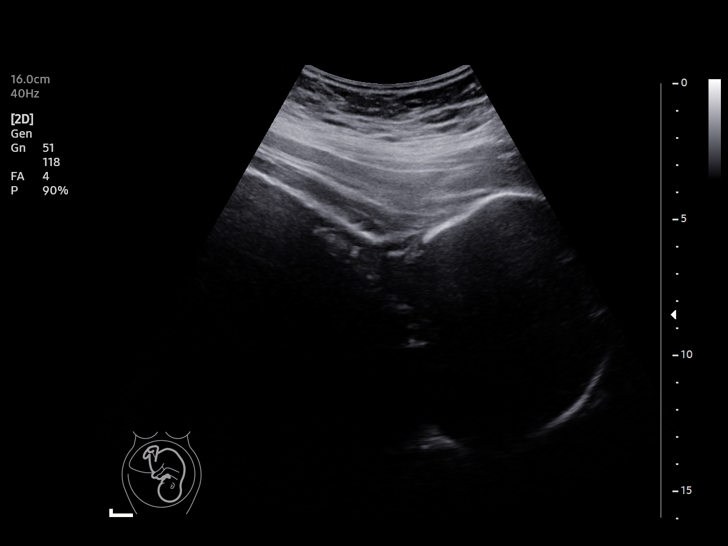
[im 2/10]
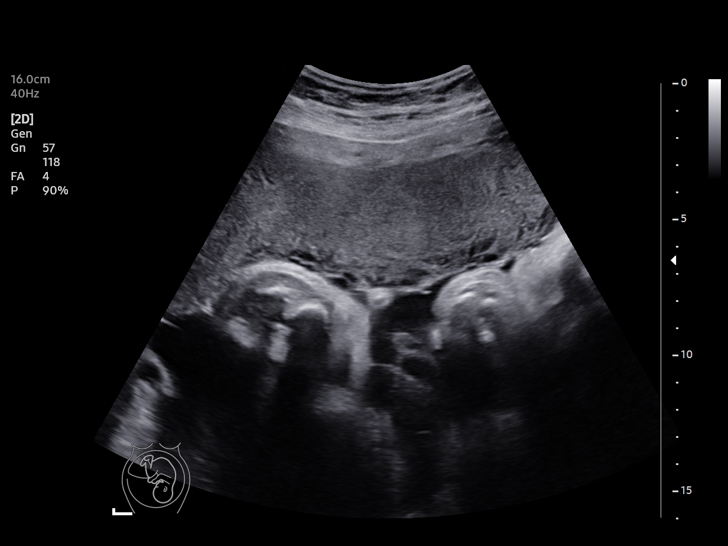
[im 3/10]
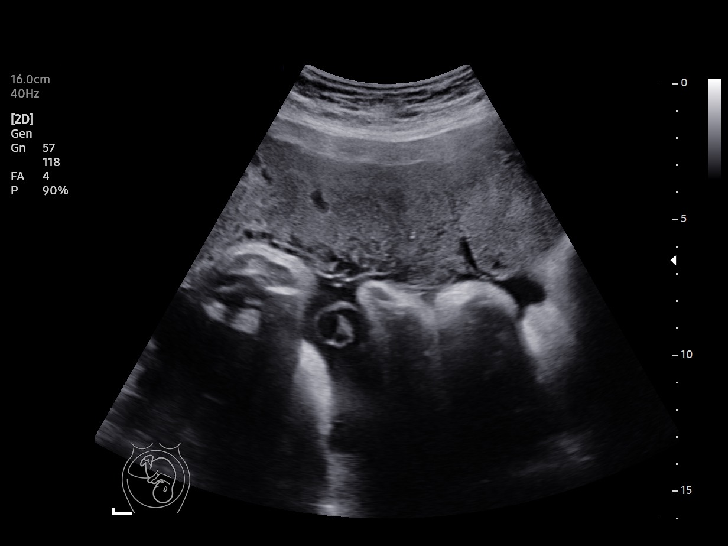
[im 4/10]
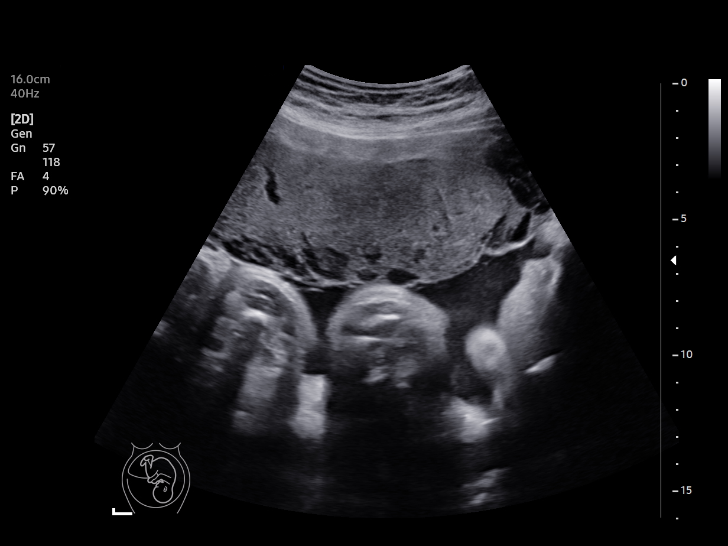
[im 5/10]
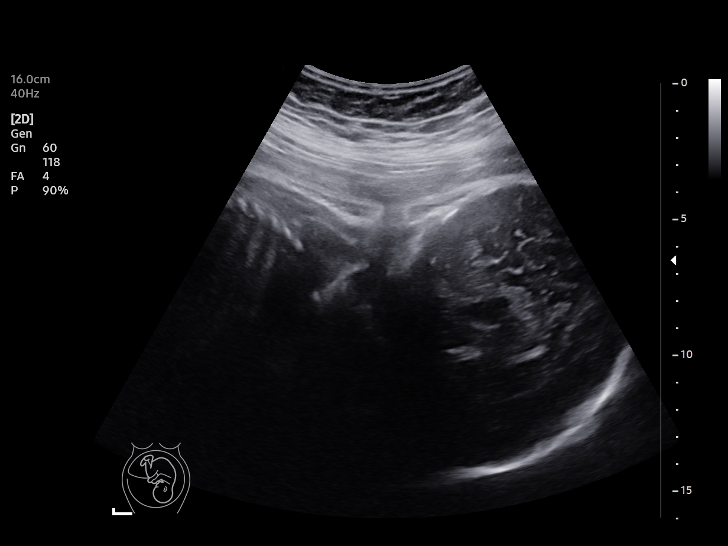
[im 6/10]
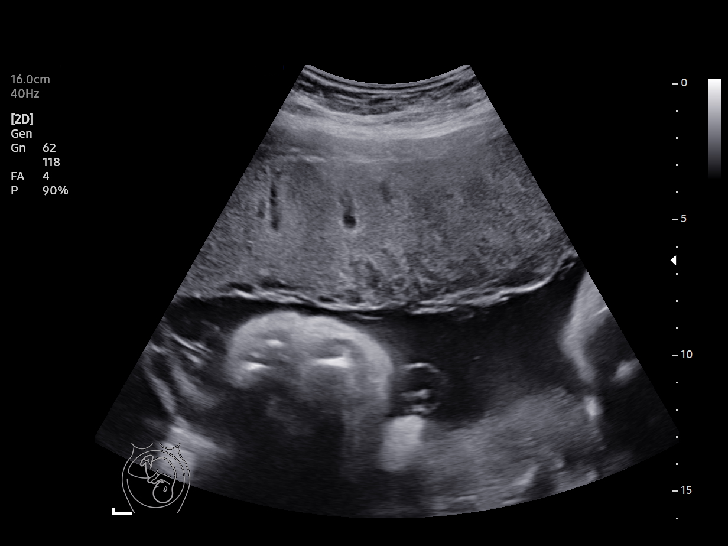
[im 7/10]
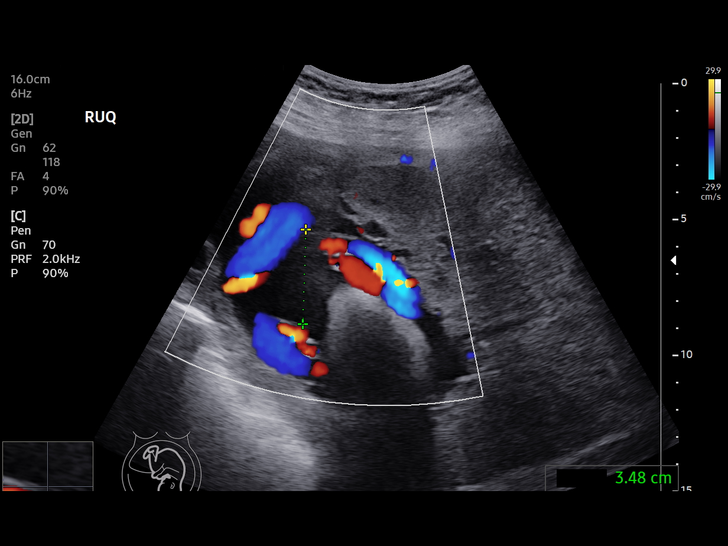
[im 8/10]
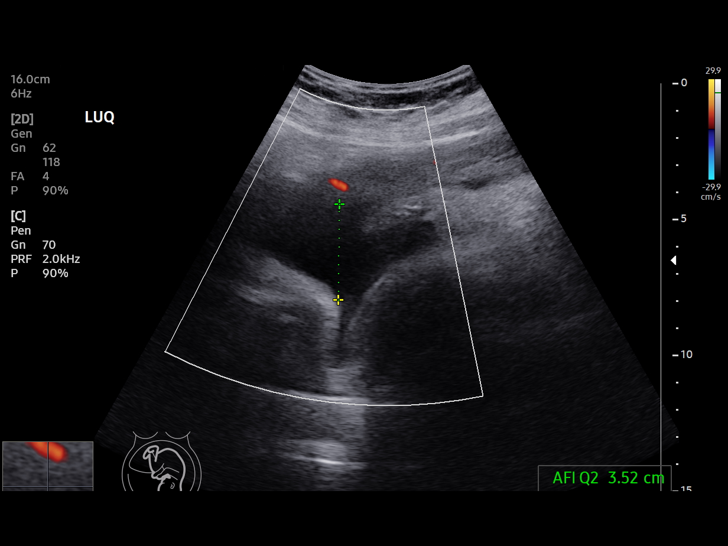
[im 9/10]
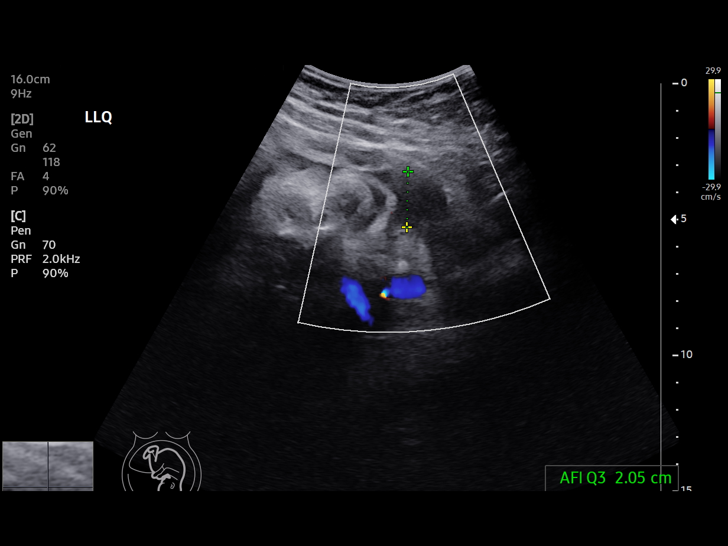
[im 10/10]
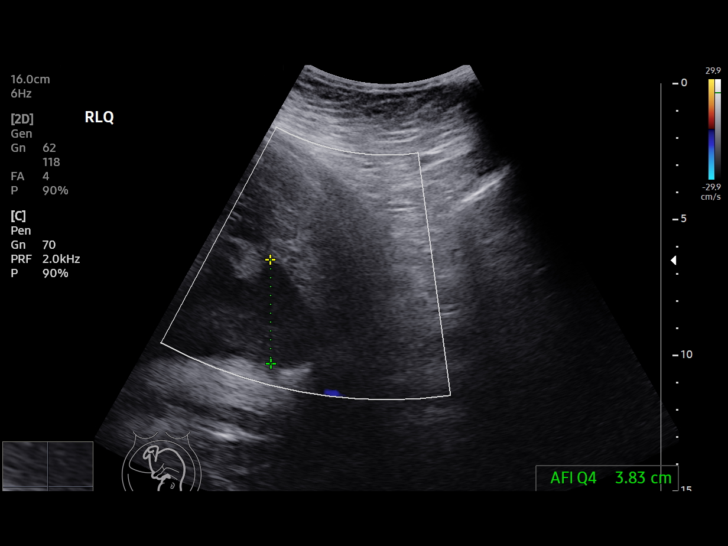

[10 of 10 positions shown; findings below may reference images not displayed]

Healthcare at
                   [id number]

 1  US FETAL BPP W/NONSTRESS              76818.4     [facility]

Service(s) Provided

Indications

 38 weeks gestation of pregnancy
 Advanced maternal age multigravida 35+,
 third trimester
Fetal Evaluation

 Num Of Fetuses:         1
 Preg. Location:         Intrauterine
 Cardiac Activity:       Observed
 Presentation:           Cephalic

 Amniotic Fluid
 AFI FV:      Within normal limits

 AFI Sum(cm)     %Tile       Largest Pocket(cm)
 12.9            51

 RUQ(cm)       RLQ(cm)       LUQ(cm)        LLQ(cm)

Biophysical Evaluation

 Amniotic F.V:   Pocket => 2 cm             F. Tone:        Observed
 F. Movement:    Observed                   N.S.T:          Reactive
 F. Breathing:   Not Observed               Score:          [DATE]
OB History

 Gravidity:    5         Term:   3        Prem:   0        SAB:   0
 TOP:          1       Ectopic:  0        Living: 3
Gestational Age

 LMP:           34w 5d        Date:  08/20/19                 EDD:   05/26/20
 Best:          38w 6d     Det. By:  U/S  (01/11/20)          EDD:   04/27/20
Impression

 Antenatal testing is reassuring with BPP [DATE].  Normal
 amniotic fluid volume. Cephalic presentation.
Recommendations

 Continue weekly antenatal testing till delivery .
              Moolman, Klpigbb

## 2022-06-14 ENCOUNTER — Encounter (HOSPITAL_BASED_OUTPATIENT_CLINIC_OR_DEPARTMENT_OTHER): Payer: Self-pay

## 2022-06-14 ENCOUNTER — Telehealth (HOSPITAL_BASED_OUTPATIENT_CLINIC_OR_DEPARTMENT_OTHER): Payer: Self-pay | Admitting: Emergency Medicine

## 2022-06-14 ENCOUNTER — Other Ambulatory Visit: Payer: Self-pay

## 2022-06-14 ENCOUNTER — Emergency Department (HOSPITAL_BASED_OUTPATIENT_CLINIC_OR_DEPARTMENT_OTHER)
Admission: EM | Admit: 2022-06-14 | Discharge: 2022-06-14 | Disposition: A | Discharge status: Home / Self Care | Payer: No Typology Code available for payment source | Attending: Emergency Medicine | Admitting: Emergency Medicine

## 2022-06-14 DIAGNOSIS — M5410 Radiculopathy, site unspecified: Secondary | ICD-10-CM

## 2022-06-14 DIAGNOSIS — Z87891 Personal history of nicotine dependence: Secondary | ICD-10-CM | POA: Insufficient documentation

## 2022-06-14 DIAGNOSIS — M545 Low back pain, unspecified: Secondary | ICD-10-CM | POA: Insufficient documentation

## 2022-06-14 MED ORDER — PREDNISONE 50 MG PO TABS
60.0000 mg | ORAL_TABLET | Freq: Once | ORAL | Status: AC
  Administered 2022-06-14: 60 mg via ORAL
  Filled 2022-06-14: qty 1

## 2022-06-14 MED ORDER — HYDROCODONE-ACETAMINOPHEN 5-325 MG PO TABS: 1.0000 | ORAL_TABLET | ORAL | 0 refills | Status: AC | PRN

## 2022-06-14 MED ORDER — HYDROMORPHONE HCL 1 MG/ML IJ SOLN
1.0000 mg | Freq: Once | INTRAMUSCULAR | Status: AC
  Administered 2022-06-14: 1 mg via INTRAMUSCULAR
  Filled 2022-06-14: qty 1

## 2022-06-14 MED ORDER — HYDROCODONE-ACETAMINOPHEN 5-325 MG PO TABS: 1.0000 | ORAL_TABLET | ORAL | 0 refills | Status: DC | PRN

## 2022-06-14 MED ORDER — PREDNISONE 20 MG PO TABS: 40.0000 mg | ORAL_TABLET | Freq: Every day | ORAL | 0 refills | Status: AC

## 2022-06-14 NOTE — Discharge Instructions (Signed)
You were evaluated in the Emergency Department and after careful evaluation, we did not find any emergent condition requiring admission or further testing in the hospital.  Your exam/testing today was overall reassuring.  Take the prednisone as directed.  Use the norco as needed.  Call the number to schedule follow up with the spine experts.  Please return to the Emergency Department if you experience any worsening of your condition.  Thank you for allowing [place] to be a part of your care.

## 2022-06-14 NOTE — ED Provider Notes (Signed)
[facility] Emergency Department Provider Note MRN:  [id number]  Arrival date & time: 06/14/22     Chief Complaint   Back pain History of Present Illness   Sophia Meza is a 42 y.o. year-old female with no pertinent past medical history presenting to the ED with chief complaint of back pain.  Pain to the left lower back with radiation to the anterior thigh.  Present for several years but worsening over the past few months.  Denies numbness or weakness to the arms or legs, no bowel or bladder dysfunction, no fever.  No recent trauma.  Was given muscle relaxers in urgent care but not helping.  Review of Systems  A thorough review of systems was obtained and all systems are negative except as noted in the HPI and PMH.   Patient's Health History    Past Medical History:  Diagnosis Date   Anemia    Preeclampsia    Pregnancy induced hypertension     Past Surgical History:  Procedure Laterality Date   KNEE SURGERY Right [id number]   UMBILICAL HERNIA REPAIR  2014    Family History  Problem Relation Age of Onset   Diabetes Mother    Diabetes Father     Social History   Socioeconomic History   Marital status: Single    Spouse name: Not on file   Number of children: Not on file   Years of education: Not on file   Highest education level: Not on file  Occupational History   Not on file  Tobacco Use   Smoking status: Former    Packs/day: 0.00    Types: Cigarettes    Quit date: 01/10/2014    Years since quitting: 8.4   Smokeless tobacco: Never   Tobacco comments:    socially  Vaping Use   Vaping Use: Never used  Substance and Sexual Activity   Alcohol use: Not Currently    Comment: occasionally-social   Drug use: Never   Sexual activity: Yes    Birth control/protection: None  Other Topics Concern   Not on file  Social History Narrative   Not on file   Social Determinants of Health   Financial Resource Strain: Not on file  Food  Insecurity: Food Insecurity Present (04/03/2020)   Hunger Vital Sign    Worried About Running Out of Food in the Last Year: Sometimes true    Ran Out of Food in the Last Year: Never true  Transportation Needs: Unmet Transportation Needs (04/03/2020)   PRAPARE - Hydrologist (Medical): Yes    Lack of Transportation (Non-Medical): Yes  Physical Activity: Not on file  Stress: Not on file  Social Connections: Not on file  Intimate Partner Violence: Not on file     Physical Exam   Vitals:   06/14/22 0316  BP: 131/83  Pulse: 74  Resp: 17  Temp: 99 F (37.2 C)  SpO2: 98%    CONSTITUTIONAL: Well-appearing, NAD NEURO/PSYCH:  Alert and oriented x 3, no focal deficits EYES:  eyes equal and reactive ENT/NECK:  no LAD, no JVD CARDIO: Regular rate, well-perfused, normal S1 and S2 PULM:  CTAB no wheezing or rhonchi GI/GU:  non-distended, non-tender MSK/SPINE:  No gross deformities, no edema SKIN:  no rash, atraumatic   *Additional and/or pertinent findings included in MDM below  Diagnostic and Interventional Summary    EKG Interpretation  Date/Time:    Ventricular Rate:    PR Interval:  QRS Duration:   QT Interval:    QTC Calculation:   R Axis:     Text Interpretation:         Labs Reviewed - No data to display  No orders to display    Medications  HYDROmorphone (DILAUDID) injection 1 mg (has no administration in time range)  predniSONE (DELTASONE) tablet 60 mg (has no administration in time range)     Procedures  /  Critical Care Procedures  ED Course and Medical Decision Making  Initial Impression and Ddx Overall reassuring exam, no signs or symptoms of myelopathy.  No recent trauma, no indication for imaging at this time.  Acute on chronic radicular back pain or sciatica is the favored diagnosis.  Given that anti-inflammatories, muscle relaxers are not working for her, appropriate for a trial of steroid burst, referred to  neurosurgery.  Past medical/surgical history that increases complexity of ED encounter: None  Interpretation of Diagnostics Laboratory and/or imaging options to aid in the diagnosis/care of the patient were considered.  After careful history and physical examination, it was determined that there was no indication for diagnostics at this time.  Patient Reassessment and Ultimate Disposition/Management     Discharge  Patient management required discussion with the following services or consulting groups:  None  Complexity of Problems Addressed Acute complicated illness or Injury  Additional Data Reviewed and Analyzed Further history obtained from: Further history from spouse/family member  Additional Factors Impacting ED Encounter Risk Prescriptions  Barth Kirks. Sedonia Small, [place] [email or web address]  Final Clinical Impressions(s) / ED Diagnoses     ICD-10-CM   1. Radicular low back pain  M54.10       ED Discharge Orders          Ordered    predniSONE (DELTASONE) 20 MG tablet  Daily        06/14/22 0333    HYDROcodone-acetaminophen (NORCO/VICODIN) 5-325 MG tablet  Every 4 hours PRN        06/14/22 0333             Discharge Instructions Discussed with and Provided to Patient:    Discharge Instructions      You were evaluated in the Emergency Department and after careful evaluation, we did not find any emergent condition requiring admission or further testing in the hospital.  Your exam/testing today was overall reassuring.  Take the prednisone as directed.  Use the norco as needed.  Call the number to schedule follow up with the spine experts.  Please return to the Emergency Department if you experience any worsening of your condition.  Thank you for allowing [place] to be a part of your care.       Maudie Flakes, MD 06/14/22 [phone]

## 2022-06-14 NOTE — Telephone Encounter (Signed)
Resent rx for hydrocodone-acetaminophen as ordered by Dr. Sedonia Small given failure of transmission to pharmacy.

## 2022-06-14 NOTE — ED Triage Notes (Signed)
Pt reports hx of pain in lower back radiating down front/side of left upper thigh intermittently over past 5 years. Pt states pain usually goes away on its own without use of medications. Pt states pain has been more consistent over past couple of months and is not improving with use of medications. Pt states pain is worse with certain movements and "worse when I climax". Pt reports going to urgent care yesterday and receiving an injection for pain and muscle relaxer prescription, no improvement. They also gave patient referral for neurosurgery to evaluate outpatient. No appointment made at this time.

## 2022-06-15 ENCOUNTER — Emergency Department (HOSPITAL_COMMUNITY)
Admission: EM | Admit: 2022-06-15 | Discharge: 2022-06-16 | Disposition: A | Discharge status: Home / Self Care | Payer: No Typology Code available for payment source | Attending: Emergency Medicine | Admitting: Emergency Medicine

## 2022-06-15 ENCOUNTER — Encounter (HOSPITAL_COMMUNITY): Payer: Self-pay

## 2022-06-15 DIAGNOSIS — M5432 Sciatica, left side: Secondary | ICD-10-CM

## 2022-06-15 DIAGNOSIS — M5126 Other intervertebral disc displacement, lumbar region: Secondary | ICD-10-CM | POA: Diagnosis not present

## 2022-06-15 DIAGNOSIS — M51369 Other intervertebral disc degeneration, lumbar region without mention of lumbar back pain or lower extremity pain: Secondary | ICD-10-CM

## 2022-06-15 DIAGNOSIS — M79605 Pain in left leg: Secondary | ICD-10-CM | POA: Diagnosis present

## 2022-06-15 DIAGNOSIS — M5136 Other intervertebral disc degeneration, lumbar region: Secondary | ICD-10-CM

## 2022-06-15 NOTE — ED Triage Notes (Signed)
Patient arrived with complaints of left thigh pain that's been worsening over the last few months. Given a muscle relaxer with no relief. Referred to neurosurgery.

## 2022-06-15 NOTE — ED Provider Notes (Signed)
[place] EMERGENCY DEPARTMENT AT [facility] Provider Note   CSN: [id number] Arrival date & time: 06/15/22  2252     History  Chief Complaint  Patient presents with   Leg Pain    Sophia Meza is a 42 y.o. female.  The history is provided by the patient.  Leg Pain She has history of pregnancy-induced hypertension and comes in with ongoing pain in her left leg which radiates to the left groin and left lumbar area.  She has been having pain like this intermittently for several years but it has been getting worse over the last several weeks.  3 days ago, it got severe and she went to an urgent care center where she was given a prescription for methocarbamol which has not been giving her relief.  She was seen in the emergency last night and given a prescription for prednisone and hydrocodone which are also not helping.  Pain is constant.  She denies any bowel or bladder dysfunction.  She does endorse some numbness over the anterior aspect of the left thigh but denies any weakness.  She denies any recent trauma.   Home Medications Prior to Admission medications   Medication Sig Start Date End Date Taking? Authorizing Provider  amLODipine (NORVASC) 5 MG tablet TAKE 1 TABLET (5 MG TOTAL) BY MOUTH DAILY. 04/25/20 04/25/21  Shirlean Mylar, MD  famotidine (PEPCID) 20 MG tablet Take 1 tablet (20 mg total) by mouth 2 (two) times daily. 03/19/20   Conan Bowens, MD  HYDROcodone-acetaminophen (NORCO/VICODIN) 5-325 MG tablet Take 1 tablet by mouth every 4 (four) hours as needed. 06/14/22   Alvira Monday, MD  predniSONE (DELTASONE) 20 MG tablet Take 2 tablets (40 mg total) by mouth daily for 4 days. 06/14/22 06/18/22  Sabas Sous, MD  Prenatal Vit-Fe Fumarate-FA (PRENATAL VITAMINS PO) Take 1 tablet by mouth daily.    [provider]      Allergies    Patient has no known allergies.    Review of Systems   Review of Systems  All other systems reviewed and are  negative.   Physical Exam Updated Vital Signs BP (!) 165/103 (BP Location: Right Arm)   Pulse 73   Temp 98.2 F (36.8 C) (Oral)   Resp 18   Ht 5\' 6"  (1.676 m)   Wt 122.5 kg   SpO2 95%   BMI 43.58 kg/m  Physical Exam Vitals and nursing note reviewed.   42 year old female, resting comfortably and in no acute distress. Vital signs are significant for elevated blood pressure. Oxygen saturation is 95%, which is normal. Head is normocephalic and atraumatic. PERRLA, EOMI. Oropharynx is clear. Neck is nontender and supple without adenopathy or JVD. Back is mildly tender in the mid lumbar area with moderate left paralumbar spasm.  There is a positive straight leg raise on the left at 30 degrees and crossed straight leg raise on the right at 60 degrees. Lungs are clear without rales, wheezes, or rhonchi. Chest is nontender. Heart has regular rate and rhythm without murmur. Abdomen is soft, flat, nontender. Extremities have no cyanosis or edema, full range of motion is present. Skin is warm and dry without rash. Neurologic: Mental status is normal, cranial nerves are intact.  Strength is 5/5 in all 4 extremities, no objective sensory deficits.  ED Results / Procedures / Treatments   Labs (all labs ordered are listed, but only abnormal results are displayed) Labs Reviewed - No data to display  Radiology No results found.  Procedures Procedures    Medications Ordered in ED Medications - No data to display  ED Course/ Medical Decision Making/ A&P                             Medical Decision Making Amount and/or Complexity of Data Reviewed Labs: ordered. Radiology: ordered.  Risk OTC drugs. Prescription drug management.   Left-sided sciatica which has failed to respond to initial attempt at outpatient management.  No red flags to suggest actual neurologic injury.  I have reviewed her past records and note ED visit yesterday where she was given intramuscular hydromorphone  and oral prednisone.  She states that she did feel somewhat better when she left the ED.  I will try to get better symptomatic pain control.  I have ordered intravenous methocarbamol, ketorolac, morphine as well as oral acetaminophen.  May need to consider MRI scan to delineate actual pathology.  She had no relief of pain with above-noted treatment.  I have ordered a dose of hydromorphone and I have ordered an MRI scan of her lumbar spine.  Case is signed out to Dr. Denton Lank, oncoming physician.  Final Clinical Impression(s) / ED Diagnoses Final diagnoses:  Left sided sciatica    Rx / DC Orders ED Discharge Orders     None         Dione Booze, MD 06/16/22 [phone]

## 2022-06-16 ENCOUNTER — Emergency Department (HOSPITAL_COMMUNITY): Payer: No Typology Code available for payment source

## 2022-06-16 DIAGNOSIS — M5432 Sciatica, left side: Secondary | ICD-10-CM | POA: Diagnosis not present

## 2022-06-16 LAB — PREGNANCY, URINE: Preg Test, Ur: NEGATIVE

## 2022-06-16 MED ORDER — ONDANSETRON HCL 4 MG/2ML IJ SOLN
4.0000 mg | Freq: Once | INTRAMUSCULAR | Status: AC
  Administered 2022-06-16: 4 mg via INTRAVENOUS
  Filled 2022-06-16: qty 2

## 2022-06-16 MED ORDER — KETOROLAC TROMETHAMINE 30 MG/ML IJ SOLN
30.0000 mg | Freq: Once | INTRAMUSCULAR | Status: AC
  Administered 2022-06-16: 30 mg via INTRAVENOUS
  Filled 2022-06-16: qty 1

## 2022-06-16 MED ORDER — HYDROMORPHONE HCL 1 MG/ML IJ SOLN
1.0000 mg | Freq: Once | INTRAMUSCULAR | Status: AC
  Administered 2022-06-16: 1 mg via INTRAVENOUS
  Filled 2022-06-16: qty 1

## 2022-06-16 MED ORDER — MORPHINE SULFATE (PF) 4 MG/ML IV SOLN
4.0000 mg | Freq: Once | INTRAVENOUS | Status: AC
Start: 2022-06-16 — End: 2022-06-16
  Administered 2022-06-16: 4 mg via INTRAVENOUS
  Filled 2022-06-16: qty 1

## 2022-06-16 MED ORDER — PREDNISONE 20 MG PO TABS: ORAL_TABLET | ORAL | 0 refills | Status: AC

## 2022-06-16 MED ORDER — HYDROMORPHONE HCL 2 MG PO TABS: 2.0000 mg | ORAL_TABLET | ORAL | 0 refills | Status: AC | PRN

## 2022-06-16 MED ORDER — DEXAMETHASONE SODIUM PHOSPHATE 10 MG/ML IJ SOLN
10.0000 mg | Freq: Once | INTRAMUSCULAR | Status: AC
  Administered 2022-06-16: 10 mg via INTRAVENOUS
  Filled 2022-06-16: qty 1

## 2022-06-16 MED ORDER — METHOCARBAMOL 1000 MG/10ML IJ SOLN
1000.0000 mg | Freq: Once | INTRAVENOUS | Status: AC
  Administered 2022-06-16: 1000 mg via INTRAVENOUS
  Filled 2022-06-16: qty 1000

## 2022-06-16 MED ORDER — HYDROMORPHONE HCL 1 MG/ML IJ SOLN
1.0000 mg | Freq: Once | INTRAMUSCULAR | Status: AC
Start: 2022-06-16 — End: 2022-06-16
  Administered 2022-06-16: 1 mg via INTRAVENOUS
  Filled 2022-06-16: qty 1

## 2022-06-16 MED ORDER — ACETAMINOPHEN 325 MG PO TABS
650.0000 mg | ORAL_TABLET | Freq: Once | ORAL | Status: AC
  Administered 2022-06-16: 650 mg via ORAL
  Filled 2022-06-16: qty 2

## 2022-06-16 NOTE — ED Provider Notes (Addendum)
Signed out by Dr Roxanne Mins to check MRI result.  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   Pt notes persistent pain radiating from back to left leg/L3.  Dilaudid iv.    Pt requests specialist be called, persistent/continual pain.  Neurosurgery consulted.   Discussed pt with Dr Ronnald Ramp (incl 2nd ed visit, pt preference to have surgery facilitated asap) who reviewed MRI and indicates can see in office tomorrow, w potential ability to facilitate surgery by end of week.    Pt currently appears comfortable/has found position of comfort.   No new numbness/weakness. No problems w normal bowel/bladder fxn.  Pt currently appears stable for d/c.       Lajean Saver, MD 06/16/22 [phone]

## 2022-06-16 NOTE — ED Notes (Signed)
EDP at [facility] 

## 2022-06-16 NOTE — ED Notes (Signed)
Pt to MRI at this time.

## 2022-06-16 NOTE — ED Notes (Signed)
Attempted d/c by EMTP

## 2022-06-16 NOTE — ED Provider Notes (Incomplete)
[place] EMERGENCY DEPARTMENT AT [facility] Provider Note   CSN: [id number] Arrival date & time: 06/15/22  2252     History {Add pertinent medical, surgical, social history, OB history to HPI:1} Chief Complaint  Patient presents with  . Leg Pain    Sophia Meza is a 42 y.o. female.  The history is provided by the patient.  Leg Pain She has history of pregnancy-induced hypertension and comes in with ongoing pain in her left leg which radiates to the left groin and left lumbar area.  She has been having pain like this intermittently for several years but it has been getting worse over the last several weeks.  3 days ago, it got severe and she went to an urgent care center where she was given a prescription for methocarbamol which has not been giving her relief.  She was seen in the emergency last night and given a prescription for prednisone and hydrocodone which are also not helping.  Pain is constant.  She denies any bowel or bladder dysfunction.  She does endorse some numbness over the anterior aspect of the left thigh but denies any weakness.  She denies any recent trauma.   Home Medications Prior to Admission medications   Medication Sig Start Date End Date Taking? Authorizing Provider  amLODipine (NORVASC) 5 MG tablet TAKE 1 TABLET (5 MG TOTAL) BY MOUTH DAILY. 04/25/20 04/25/21  Gladys Damme, MD  famotidine (PEPCID) 20 MG tablet Take 1 tablet (20 mg total) by mouth 2 (two) times daily. 03/19/20   Sloan Leiter, MD  HYDROcodone-acetaminophen (NORCO/VICODIN) 5-325 MG tablet Take 1 tablet by mouth every 4 (four) hours as needed. 06/14/22   Gareth Morgan, MD  predniSONE (DELTASONE) 20 MG tablet Take 2 tablets (40 mg total) by mouth daily for 4 days. 06/14/22 06/18/22  Maudie Flakes, MD  Prenatal Vit-Fe Fumarate-FA (PRENATAL VITAMINS PO) Take 1 tablet by mouth daily.    [provider]      Allergies    Patient has no known allergies.    Review of Systems    Review of Systems  All other systems reviewed and are negative.   Physical Exam Updated Vital Signs BP (!) 165/103 (BP Location: Right Arm)   Pulse 73   Temp 98.2 F (36.8 C) (Oral)   Resp 18   Ht '5\' 6"'$  (1.676 m)   Wt 122.5 kg   SpO2 95%   BMI 43.58 kg/m  Physical Exam Vitals and nursing note reviewed.   42 year old female, resting comfortably and in no acute distress. Vital signs are significant for elevated blood pressure. Oxygen saturation is 95%, which is normal. Head is normocephalic and atraumatic. PERRLA, EOMI. Oropharynx is clear. Neck is nontender and supple without adenopathy or JVD. Back is mildly tender in the mid lumbar area with moderate left paralumbar spasm.  There is a positive straight leg raise on the left at 30 degrees and crossed straight leg raise on the right at 60 degrees. Lungs are clear without rales, wheezes, or rhonchi. Chest is nontender. Heart has regular rate and rhythm without murmur. Abdomen is soft, flat, nontender. Extremities have no cyanosis or edema, full range of motion is present. Skin is warm and dry without rash. Neurologic: Mental status is normal, cranial nerves are intact.  Strength is 5/5 in all 4 extremities, no objective sensory deficits.  ED Results / Procedures / Treatments   Labs (all labs ordered are listed, but only abnormal results are  displayed) Labs Reviewed - No data to display  Radiology No results found.  Procedures Procedures  {Document cardiac monitor, telemetry assessment procedure when appropriate:1}  Medications Ordered in ED Medications - No data to display  ED Course/ Medical Decision Making/ A&P   {   Click here for ABCD2, HEART and other calculatorsREFRESH Note before signing :1}                          Medical Decision Making Risk OTC drugs. Prescription drug management.   Left-sided sciatica which has failed to respond to initial attempt at outpatient management.  No red flags to suggest  actual neurologic injury.  I have reviewed her past records and note ED visit yesterday  {Document critical care time when appropriate:1} {Document review of labs and clinical decision tools ie heart score, Chads2Vasc2 etc:1}  {Document your independent review of radiology images, and any outside records:1} {Document your discussion with family members, caretakers, and with consultants:1} {Document social determinants of health affecting pt's care:1} {Document your decision making why or why not admission, treatments were needed:1} Final Clinical Impression(s) / ED Diagnoses Final diagnoses:  None    Rx / DC Orders ED Discharge Orders     None

## 2022-06-16 NOTE — Discharge Instructions (Addendum)
It was our pleasure to provide your ER care today - we hope that you feel better.  Try to find a position of comfort - for example, lying on back with pillow behind knees or lying on side with pillow between knees.   Take prednisone as prescribed.  You may take dilaudid as need, as prescribed for pain - no driving for the next 8 hours or if/when taking dilaudid.   We discussed your case and your MRI with our neurosurgeon on-call, Dr Ronnald Ramp - he indicates for you to follow up with him in the office tomorrow - call office now/today to arrange appointment time - when you call, tell them that we in the ER discussed your case with Dr Ronnald Ramp and that he wanted them to see you tomorrow.   Return to ER if worse, new symptoms, fevers, numbness/weakness, intractable pain, or other concern.

## 2022-06-18 ENCOUNTER — Other Ambulatory Visit: Payer: Self-pay | Admitting: Neurological Surgery

## 2022-06-24 ENCOUNTER — Inpatient Hospital Stay (HOSPITAL_COMMUNITY): Admission: RE | Admit: 2022-06-24 | Payer: No Typology Code available for payment source | Source: Ambulatory Visit

## 2022-06-30 ENCOUNTER — Ambulatory Visit: Admit: 2022-06-30 | Payer: No Typology Code available for payment source | Admitting: Neurological Surgery

## 2022-06-30 SURGERY — LUMBAR LAMINECTOMY/DECOMPRESSION MICRODISCECTOMY 1 LEVEL
Anesthesia: General | Site: Back | Laterality: Left
